# Patient Record
Sex: Female | Born: 1980
Health system: Southern US, Community
[De-identification: ages and names within clinical notes are randomized; demographics above are authoritative.]

## PROBLEM LIST (undated history)

## (undated) DIAGNOSIS — T7840XA Allergy, unspecified, initial encounter: Secondary | ICD-10-CM

## (undated) DIAGNOSIS — G43909 Migraine, unspecified, not intractable, without status migrainosus: Secondary | ICD-10-CM

## (undated) DIAGNOSIS — I10 Essential (primary) hypertension: Secondary | ICD-10-CM

## (undated) HISTORY — DX: Allergy, unspecified, initial encounter: T78.40XA

## (undated) HISTORY — DX: Essential (primary) hypertension: I10

## (undated) HISTORY — DX: Migraine, unspecified, not intractable, without status migrainosus: G43.909

---
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
## 2002-12-09 HISTORY — PX: SHOULDER SURGERY: SHX246

## 2006-12-31 ENCOUNTER — Encounter: Admission: RE | Admit: 2006-12-31 | Discharge: 2007-03-04 | Payer: Self-pay | Admitting: Orthopaedic Surgery

## 2007-07-06 ENCOUNTER — Emergency Department (HOSPITAL_COMMUNITY): Admission: EM | Admit: 2007-07-06 | Discharge: 2007-07-06 | Payer: Self-pay | Admitting: Emergency Medicine

## 2008-07-05 ENCOUNTER — Ambulatory Visit (HOSPITAL_COMMUNITY): Admission: RE | Admit: 2008-07-05 | Discharge: 2008-07-05 | Payer: Self-pay | Admitting: General Practice

## 2011-09-06 LAB — CBC
MCV: 83
Platelets: 288
RBC: 5.18 — ABNORMAL HIGH
WBC: 5.1

## 2011-09-06 LAB — SEDIMENTATION RATE: Sed Rate: 8

## 2011-09-06 LAB — DIFFERENTIAL
Basophils Absolute: 0
Eosinophils Relative: 3
Lymphocytes Relative: 31
Lymphs Abs: 1.6
Neutro Abs: 3.1
Neutrophils Relative %: 60

## 2011-09-06 LAB — C-REACTIVE PROTEIN: CRP: 0.4 — ABNORMAL LOW (ref <0.6)

## 2013-12-09 HISTORY — PX: NASAL SEPTUM SURGERY: SHX37

## 2016-07-29 ENCOUNTER — Ambulatory Visit (INDEPENDENT_AMBULATORY_CARE_PROVIDER_SITE_OTHER): Payer: BLUE CROSS/BLUE SHIELD | Admitting: Podiatry

## 2016-07-29 ENCOUNTER — Ambulatory Visit (INDEPENDENT_AMBULATORY_CARE_PROVIDER_SITE_OTHER): Payer: BLUE CROSS/BLUE SHIELD

## 2016-07-29 DIAGNOSIS — M722 Plantar fascial fibromatosis: Secondary | ICD-10-CM | POA: Diagnosis not present

## 2016-07-29 DIAGNOSIS — M79672 Pain in left foot: Secondary | ICD-10-CM

## 2016-07-29 DIAGNOSIS — M79671 Pain in right foot: Secondary | ICD-10-CM | POA: Diagnosis not present

## 2016-07-29 MED ORDER — TRIAMCINOLONE ACETONIDE 10 MG/ML IJ SUSP: 10.0000 mg | Freq: Once | INTRAMUSCULAR | Status: DC

## 2016-07-29 MED ORDER — TRIAMCINOLONE ACETONIDE 10 MG/ML IJ SUSP
10.0000 mg | Freq: Once | INTRAMUSCULAR | Status: AC
  Administered 2016-07-29: 10 mg

## 2016-07-29 MED ORDER — DICLOFENAC SODIUM 75 MG PO TBEC: 75.0000 mg | DELAYED_RELEASE_TABLET | Freq: Two times a day (BID) | ORAL | 2 refills | Status: DC

## 2016-07-29 NOTE — Progress Notes (Signed)
Subjective:     Patient ID: Kristin Downs, female   DOB: 1981/10/08, 35 y.o.   MRN: [id number]  HPI patient presents stating that she's been having a lot of pain in both of her heels and it's been going on now for at least 6 months and she is a nurse   Review of Systems  All other systems reviewed and are negative.      Objective:   Physical Exam  Constitutional: She is oriented to person, place, and time.  Cardiovascular: Intact distal pulses.   Musculoskeletal: Normal range of motion.  Neurological: She is oriented to person, place, and time.  Skin: Skin is warm.  Nursing note and vitals reviewed.  Neurovascular status intact muscle strength adequate range of motion within normal limits with patient found to have a lot of discomfort plantar heel bilateral at the insertional point of the tendon into the calcaneus with depression of the arch noted. Patient's found have good digital perfusion and is well oriented 3     Assessment:     Acute plantar fasciitis bilateral    Plan:     H&P condition reviewed and x-rays reviewed. Injected the plantar fascia bilateral 3 mg Kenalog 5 mill grams Xylocaine and applied fascial brace bilateral and placed on diclofenac 75 mg twice a day. Patient was given instructions on physical therapy will be seen back in 2 weeks  X-ray report indicated spur formation with mild depression of the arch and no indication stress fracture arthritis

## 2016-07-29 NOTE — Progress Notes (Signed)
   Subjective:    Patient ID: Kristin Downs, Kristin Downs    DOB: 05/18/1981, 35 y.o.   MRN: [id number]  HPI  I am having pain in both heels for the last 6 months it has gotten worse.  On my feet 8-10 hours per day     Review of Systems  Neurological: Positive for headaches.       Objective:   Physical Exam        Assessment & Plan:

## 2016-07-29 NOTE — Patient Instructions (Signed)

## 2016-08-15 ENCOUNTER — Ambulatory Visit (INDEPENDENT_AMBULATORY_CARE_PROVIDER_SITE_OTHER): Payer: BLUE CROSS/BLUE SHIELD | Admitting: Podiatry

## 2016-08-15 ENCOUNTER — Encounter: Payer: Self-pay | Admitting: Podiatry

## 2016-08-15 DIAGNOSIS — M722 Plantar fascial fibromatosis: Secondary | ICD-10-CM | POA: Diagnosis not present

## 2016-08-15 MED ORDER — TRIAMCINOLONE ACETONIDE 10 MG/ML IJ SUSP
10.0000 mg | Freq: Once | INTRAMUSCULAR | Status: AC
  Administered 2016-08-15: 10 mg

## 2016-08-18 NOTE — Progress Notes (Signed)
Subjective:     Patient ID: Kristin Downs, female   DOB: 1981-09-09, 35 y.o.   MRN: [id number]  HPI patient states that I'm still getting pain but it is mildly improved from previously   Review of Systems     Objective:   Physical Exam Neurovascular status intact with pain in the right over left heel with inflammation and fluid around the medial band and depression of the arch also noted    Assessment:     Plantar fasciitis right with inflammation and fluid around the medial band right over left    Plan:     H&P x-rays reviewed and injected the plantar fascia 3 mg Kenalog 5 mg Xylocaine and applied scanning to the feet for orthotic therapy. Advised on physical therapy supportive shoes and reappoint to recheck

## 2016-09-05 ENCOUNTER — Ambulatory Visit (INDEPENDENT_AMBULATORY_CARE_PROVIDER_SITE_OTHER): Payer: BLUE CROSS/BLUE SHIELD | Admitting: Podiatry

## 2016-09-05 DIAGNOSIS — M722 Plantar fascial fibromatosis: Secondary | ICD-10-CM | POA: Diagnosis not present

## 2016-09-05 MED ORDER — TRIAMCINOLONE ACETONIDE 10 MG/ML IJ SUSP
10.0000 mg | Freq: Once | INTRAMUSCULAR | Status: AC
Start: 2016-09-05 — End: 2016-09-05
  Administered 2016-09-05: 10 mg

## 2016-09-06 NOTE — Progress Notes (Signed)
Subjective:     Patient ID: Kristin Downs, Kristin Downs   DOB: September 09, 1981, 35 y.o.   MRN: [id number]  HPI patient presents stating I am having a lot of pain in my right heel but it is improved from previous and the left one feels good   Review of Systems     Objective:   Physical Exam Neurovascular status intact with discomfort plantar heel right over left    Assessment:     Plantar fasciitis improving but still present right over left    Plan:     Orthotics dispensed and injected the right plantar fashion 3 mg Kenalog 5 mg Xylocaine and gave instructions on physical therapy

## 2016-09-27 ENCOUNTER — Telehealth: Payer: Self-pay | Admitting: *Deleted

## 2016-09-27 NOTE — Telephone Encounter (Addendum)
Pt states she just finished a 14 mile hike in 2 days and her feet do not hurt, the orthotics are great. 01/22/2017-Pt requested 2nd pair of the orthotics.

## 2016-10-17 ENCOUNTER — Ambulatory Visit: Payer: BLUE CROSS/BLUE SHIELD | Admitting: Podiatry

## 2017-01-14 MED FILL — SUMATRIPTAN SUCC 50 MG TAB: 50 | 30 days supply | Qty: 18 | Fill #0

## 2017-01-14 MED FILL — FLUoxetine HCL 40 MG CAPS: 40 | 90 days supply | Qty: 90 | Fill #0

## 2017-01-20 DIAGNOSIS — H1132 Conjunctival hemorrhage, left eye: Secondary | ICD-10-CM | POA: Diagnosis not present

## 2017-01-20 DIAGNOSIS — H53143 Visual discomfort, bilateral: Secondary | ICD-10-CM | POA: Diagnosis not present

## 2017-01-20 DIAGNOSIS — H539 Unspecified visual disturbance: Secondary | ICD-10-CM | POA: Diagnosis not present

## 2017-01-27 ENCOUNTER — Telehealth: Payer: Self-pay | Admitting: *Deleted

## 2017-01-27 NOTE — Telephone Encounter (Signed)
Pt had requested a second pair of orthotics be ordered on January 23, 2016. (Pt was originally scanned on 08/15/16)  I Left message for patient at [phone] (Home #) to let them know that I sent an [facility]-mail to [facility] requesting a second pair of orthotics be made and they should be in in 3 weeks. We will call patient when they come in.

## 2017-01-28 MED FILL — LEVONOR-ETH ESTRAD 0.1-0.02: 0.1-20 | 84 days supply | Qty: 84 | Fill #0

## 2017-01-28 MED FILL — DICLOFENAC SOD 75 MG TAB EC: 75 | 25 days supply | Qty: 50 | Fill #0

## 2017-02-04 MED FILL — SCOPOLAMINE 1 MG/3 DAY PATC: 1 | 12 days supply | Qty: 4 | Fill #0

## 2017-04-17 MED FILL — FLUoxetine HCL 40 MG CAPS: 40 | 90 days supply | Qty: 90 | Fill #1

## 2017-04-17 MED FILL — LEVONOR-ETH ESTRAD 0.1-0.02: 0.1-20 | 84 days supply | Qty: 84 | Fill #1

## 2017-05-10 DIAGNOSIS — R110 Nausea: Secondary | ICD-10-CM | POA: Diagnosis not present

## 2017-05-10 DIAGNOSIS — R51 Headache: Secondary | ICD-10-CM | POA: Diagnosis not present

## 2017-05-10 DIAGNOSIS — G43009 Migraine without aura, not intractable, without status migrainosus: Secondary | ICD-10-CM | POA: Diagnosis not present

## 2017-05-19 DIAGNOSIS — M722 Plantar fascial fibromatosis: Secondary | ICD-10-CM | POA: Diagnosis not present

## 2017-06-16 MED FILL — AMOX-CLAV 875-125 MG TABLET: 875-125 | 10 days supply | Qty: 20 | Fill #0

## 2017-07-08 MED FILL — FLUoxetine HCL 40 MG CAPS: 40 | 90 days supply | Qty: 90 | Fill #2

## 2017-07-08 MED FILL — LEVONOR-ETH ESTRAD 0.1-0.02: 0.1-20 | 84 days supply | Qty: 84 | Fill #2

## 2017-10-08 DIAGNOSIS — B078 Other viral warts: Secondary | ICD-10-CM | POA: Diagnosis not present

## 2017-10-14 MED FILL — SUMATRIPTAN SUCC 50 MG TABL: 50 | 30 days supply | Qty: 18 | Fill #1

## 2017-10-14 MED FILL — FLUoxetine HCL 40 MG CAPS: 40 | 90 days supply | Qty: 90 | Fill #3

## 2017-10-14 MED FILL — LEVONOR-ETH ESTRAD 0.1-0.02: 0.1-20 | 84 days supply | Qty: 84 | Fill #3

## 2017-12-09 DIAGNOSIS — I10 Essential (primary) hypertension: Secondary | ICD-10-CM

## 2017-12-09 HISTORY — DX: Essential (primary) hypertension: I10

## 2018-01-06 MED FILL — LEVONOR-ETH ESTRAD 0.1-0.02: 0.1-20 | 28 days supply | Qty: 28 | Fill #0

## 2018-01-06 MED FILL — FLUoxetine HCL 40 MG CAPS: 40 | 30 days supply | Qty: 30 | Fill #0

## 2018-01-29 DIAGNOSIS — Z01419 Encounter for gynecological examination (general) (routine) without abnormal findings: Secondary | ICD-10-CM | POA: Diagnosis not present

## 2018-01-29 DIAGNOSIS — Z3041 Encounter for surveillance of contraceptive pills: Secondary | ICD-10-CM | POA: Diagnosis not present

## 2018-01-29 DIAGNOSIS — F419 Anxiety disorder, unspecified: Secondary | ICD-10-CM | POA: Diagnosis not present

## 2018-01-29 DIAGNOSIS — Z124 Encounter for screening for malignant neoplasm of cervix: Secondary | ICD-10-CM | POA: Diagnosis not present

## 2018-01-29 MED FILL — busPIRone HCL 5 MG TABS: 5 | 30 days supply | Qty: 90 | Fill #0

## 2018-01-29 MED FILL — LEVONOR-ETH ESTRAD 0.1-0.02: 0.1-20 | 84 days supply | Qty: 84 | Fill #0

## 2018-01-30 MED FILL — FLUoxetine HCL 40 MG CAPS: 40 | 90 days supply | Qty: 90 | Fill #0

## 2018-03-06 DIAGNOSIS — G43119 Migraine with aura, intractable, without status migrainosus: Secondary | ICD-10-CM | POA: Diagnosis not present

## 2018-03-06 DIAGNOSIS — R110 Nausea: Secondary | ICD-10-CM | POA: Diagnosis not present

## 2018-03-16 MED FILL — CLOTRIMAZOLE-BETAMETHASONE: 1-0.05 | 14 days supply | Qty: 15 | Fill #0

## 2018-05-05 MED FILL — FLUoxetine HCL 40 MG CAPS: 40 | 90 days supply | Qty: 90 | Fill #1

## 2018-05-05 MED FILL — busPIRone HCL 5 MG TABS: 5 | 30 days supply | Qty: 90 | Fill #1

## 2018-05-05 MED FILL — LEVONOR-ETH ESTRAD 0.1-0.02: 0.1-20 | 84 days supply | Qty: 84 | Fill #1

## 2018-06-03 DIAGNOSIS — L309 Dermatitis, unspecified: Secondary | ICD-10-CM | POA: Diagnosis not present

## 2018-06-03 MED FILL — HALOBETASOL PROP 0.05% CRM: 0.05 | 30 days supply | Qty: 50 | Fill #0

## 2018-06-16 DIAGNOSIS — I10 Essential (primary) hypertension: Secondary | ICD-10-CM | POA: Diagnosis not present

## 2018-06-16 DIAGNOSIS — G43119 Migraine with aura, intractable, without status migrainosus: Secondary | ICD-10-CM | POA: Diagnosis not present

## 2018-06-18 DIAGNOSIS — Z309 Encounter for contraceptive management, unspecified: Secondary | ICD-10-CM | POA: Diagnosis not present

## 2018-06-19 MED FILL — NORETHINDRONE 0.35 MG TAB: 0.35 | 28 days supply | Qty: 28 | Fill #0

## 2018-06-27 ENCOUNTER — Ambulatory Visit (HOSPITAL_COMMUNITY)
Admission: EM | Admit: 2018-06-27 | Discharge: 2018-06-27 | Disposition: A | Discharge status: Home / Self Care | Payer: 59 | Attending: Family Medicine | Admitting: Family Medicine

## 2018-06-27 ENCOUNTER — Other Ambulatory Visit: Payer: Self-pay

## 2018-06-27 ENCOUNTER — Encounter (HOSPITAL_COMMUNITY): Payer: Self-pay

## 2018-06-27 ENCOUNTER — Ambulatory Visit (INDEPENDENT_AMBULATORY_CARE_PROVIDER_SITE_OTHER): Payer: 59

## 2018-06-27 ENCOUNTER — Ambulatory Visit (HOSPITAL_COMMUNITY): Payer: 59

## 2018-06-27 DIAGNOSIS — S62639A Displaced fracture of distal phalanx of unspecified finger, initial encounter for closed fracture: Secondary | ICD-10-CM

## 2018-06-27 DIAGNOSIS — S62665A Nondisplaced fracture of distal phalanx of left ring finger, initial encounter for closed fracture: Secondary | ICD-10-CM

## 2018-06-27 DIAGNOSIS — S6010XA Contusion of unspecified finger with damage to nail, initial encounter: Secondary | ICD-10-CM

## 2018-06-27 MED ORDER — MELOXICAM 7.5 MG PO TABS: 7.5000 mg | ORAL_TABLET | Freq: Every day | ORAL | 0 refills | Status: DC

## 2018-06-27 MED ORDER — MUPIROCIN 2 % EX OINT: 1.0000 "application " | TOPICAL_OINTMENT | Freq: Two times a day (BID) | CUTANEOUS | 0 refills | Status: DC

## 2018-06-27 MED ORDER — HYDROCODONE-ACETAMINOPHEN 5-325 MG PO TABS: 1.0000 | ORAL_TABLET | Freq: Four times a day (QID) | ORAL | 0 refills | Status: DC | PRN

## 2018-06-27 NOTE — Discharge Instructions (Signed)
Fracture to the tip of the finger.  Mobic for pain.  Norco for breakthrough pain.  Continue ice compress, elevation.  Bactroban ointment on abrasions.  Follow-up with orthopedics for further evaluation.  Monitor for worsening numbness, tingling, decrease in sensation of the finger.

## 2018-06-27 NOTE — ED Provider Notes (Signed)
[facility]    CSN: [id number] Arrival date & time: 06/27/18  1304     History   Chief Complaint Chief Complaint  Patient presents with  . Finger Injury    HPI Bobbie Pesch is a 37 y.o. female.   37 year old female comes in for left ring finger injury after smashing finger between 2 logs.  Has a small laceration around the DIP area.  States has been applying ice compress, elevation for symptoms.  Has felt that her fingertips is more cool and has had numbness to the finger as well.  Bleeding controlled.  Decreased range of motion.  Has not taken anything for the symptoms.     History reviewed. No pertinent past medical history.  There are no active problems to display for this patient.   History reviewed. No pertinent surgical history.  OB History   None      Home Medications    Prior to Admission medications   Medication Sig Start Date End Date Taking? Authorizing Provider  FLUoxetine (PROZAC) 40 MG capsule Take by mouth.   Yes [provider]  Multiple Vitamins-Minerals (MULTIVITAMIN WITH MINERALS) tablet Take 1 tablet by mouth daily.   Yes [provider]  SUMAtriptan (IMITREX) 50 MG tablet Take by mouth.   Yes [provider]  SUMAtriptan (IMITREX) 50 MG tablet TK 1 T PO INITIALLY AND MAY REPEAT IN 2 H PRN 07/24/16  Yes [provider]  diphenhydrAMINE (BENADRYL) 50 MG tablet Take by mouth.    [provider]  HYDROcodone-acetaminophen (NORCO/VICODIN) 5-325 MG tablet Take 1 tablet by mouth every 6 (six) hours as needed for severe pain. 06/27/18   Cathie Hoops, Amy V, PA-C  meloxicam (MOBIC) 7.5 MG tablet Take 1 tablet (7.5 mg total) by mouth daily. 06/27/18   Cathie Hoops, Amy V, PA-C  mupirocin ointment (BACTROBAN) 2 % Apply 1 application topically 2 (two) times daily. 06/27/18   Cathie Hoops, Amy V, PA-C  ondansetron (ZOFRAN-ODT) 4 MG disintegrating tablet Take by mouth. 04/12/15   [provider]  ORSYTHIA 0.1-20 MG-MCG tablet TK  1 T PO D 07/23/16   [provider]    Family History History reviewed. No pertinent family history.  Social History Social History   Tobacco Use  . Smoking status: Never Smoker  . Smokeless tobacco: Never Used  Substance Use Topics  . Alcohol use: Yes  . Drug use: Never     Allergies   Prednisone   Review of Systems Review of Systems  Reason unable to perform ROS: See HPI as above.     Physical Exam Triage Vital Signs ED Triage Vitals  Enc Vitals Group     BP 06/27/18 1315 (!) 130/93     Pulse Rate 06/27/18 1315 90     Resp 06/27/18 1315 17     Temp 06/27/18 1315 98.4 F (36.9 C)     Temp Source 06/27/18 1315 Oral     SpO2 06/27/18 1315 97 %     Weight --      Height --      Head Circumference --      Peak Flow --      Pain Score 06/27/18 1316 6     Pain Loc --      Pain Edu? --      Excl. in GC? --    No data found.  Updated Vital Signs BP (!) 130/93 (BP Location: Left Arm)   Pulse 90   Temp 98.4 F (36.9  C) (Oral)   Resp 17   LMP 06/22/2018 (Exact Date)   SpO2 97%   Physical Exam  Constitutional: She is oriented to person, place, and time. She appears well-developed and well-nourished. No distress.  HENT:  Head: Normocephalic and atraumatic.  Eyes: Pupils are equal, round, and reactive to light. Conjunctivae are normal.  Musculoskeletal:  See picture below.  Tip of finger is red without cyanosis.  Fingers warm and dry.  Tenderness to palpation of distal finger.  Decreased range of motion.  Strength deferred.  Sensation intact and equal bilaterally.  Radial pulse 2+ and equal bilaterally.  Cap refill <2s  Neurological: She is alert and oriented to person, place, and time.  Skin: She is not diaphoretic.          UC Treatments / Results  Labs (all labs ordered are listed, but only abnormal results are displayed) Labs Reviewed - No data to display  EKG None  Radiology Dg Finger Ring Left  Result Date: 06/27/2018 CLINICAL  DATA:  Smashed finger between 2 logs. EXAM: LEFT RING FINGER 2+V COMPARISON:  None FINDINGS: There is a nondisplaced fracture of the distal tuft. Joint spaces are maintained. No radiopaque foreign body. IMPRESSION: Nondisplaced distal tuft fracture of the ring finger. Electronically Signed   By: Rudie MeyerP.  Gallerani M.D.   On: 06/27/2018 14:00    Procedures Incision and Drainage Date/Time: 06/27/2018 2:22 PM Performed by: Belinda FisherYu, Amy V, PA-C Authorized by: Sharlene DoryWendling, Nicholas Paul, DO   Consent:    Consent obtained:  Verbal   Consent given by:  Patient   Risks discussed:  Incomplete drainage, infection, pain and bleeding   Alternatives discussed:  No treatment Location:    Type:  Subungual hematoma   Size:  0.5cm   Location:  Upper extremity   Upper extremity location:  Finger   Finger location:  L ring finger Pre-procedure details:    Skin preparation:  Hibiclens Anesthesia (see MAR for exact dosages):    Anesthesia method:  None Procedure type:    Complexity:  Simple Procedure details:    Incision type: trephination.   Incision depth:  Subungual   Drainage:  Bloody   Drainage amount:  Moderate   Wound treatment:  Wound left open Post-procedure details:    Patient tolerance of procedure:  Tolerated well, no immediate complications   (including critical care time)  Medications Ordered in UC Medications - No data to display  Initial Impression / Assessment and Plan / UC Course  I have reviewed the triage vital signs and the nursing notes.  Pertinent labs & imaging results that were available during my care of the patient were reviewed by me and considered in my medical decision making (see chart for details).    Discussed x-ray results with patient.  Tolerated nail trephination without difficulty.  States improved symptoms.  Start Mobic as directed, discussed increased risk of GI bleed with interaction to fluoxetine, discussed monitoring symptoms, patient expresses understanding.   Norco for breakthrough pain.  Bactroban for abrasions.  Return precautions given.  Otherwise follow-up with orthopedics for further evaluation management needed.  Patient expresses understanding and agrees to plan.  Final Clinical Impressions(s) / UC Diagnoses   Final diagnoses:  Closed fracture of tuft of distal phalanx of finger    ED Prescriptions    Medication Sig Dispense Auth. Provider   mupirocin ointment (BACTROBAN) 2 % Apply 1 application topically 2 (two) times daily. 22 g Yu, Amy V, PA-C   meloxicam (MOBIC) 7.5  MG tablet Take 1 tablet (7.5 mg total) by mouth daily. 15 tablet Yu, Amy V, PA-C   HYDROcodone-acetaminophen (NORCO/VICODIN) 5-325 MG tablet Take 1 tablet by mouth every 6 (six) hours as needed for severe pain. 10 tablet Belinda Fisher, PA-C     Controlled Substance Prescriptions [place] Controlled Substance Registry consulted? Yes, I have consulted the [place] Controlled Substances Registry for this patient, and feel the risk/benefit ratio today is favorable for proceeding with this prescription for a controlled substance.   Belinda Fisher, PA-C 06/27/18 1425

## 2018-06-27 NOTE — ED Notes (Signed)
Bed: UC01 Expected date:  Expected time:  Means of arrival:  Comments: appt 

## 2018-06-27 NOTE — ED Triage Notes (Signed)
[facility] and laceration to left ring finger since last night, pt states she smashed her in finger in between two logs, pt also complains that tip of finger is numb and cool at tip

## 2018-07-08 ENCOUNTER — Encounter (INDEPENDENT_AMBULATORY_CARE_PROVIDER_SITE_OTHER): Payer: Self-pay | Admitting: Physician Assistant

## 2018-07-08 ENCOUNTER — Ambulatory Visit (INDEPENDENT_AMBULATORY_CARE_PROVIDER_SITE_OTHER): Payer: 59 | Admitting: Physician Assistant

## 2018-07-08 DIAGNOSIS — M79645 Pain in left finger(s): Secondary | ICD-10-CM | POA: Diagnosis not present

## 2018-07-08 NOTE — Progress Notes (Signed)
   Office Visit Note   Patient: Kristin Downs           Date of Birth: 03-Oct-1981           MRN: [id number] Visit Date: 07/08/2018              Requested by: Kristin ChancellorPa, [facility] [address] [place], [place] [address] PCP: Kristin BirchwoodPa, [facility]   Assessment & Plan: Visit Diagnoses:  1. Pain in left finger(s)     Plan: She will wear the aluminum splint for the next 2-1/2 weeks.  Then she can start coming out of the splint and working on range motion of the finger as tolerated.  She will follow-up with us on an as-needed basis pain persist or becomes worse.  Questions encouraged and answered.  Did discuss with her that she may lose the fingernail.  Follow-Up Instructions: Return if symptoms worsen or fail to improve.   Orders:  No orders of the defined types were placed in this encounter.  No orders of the defined types were placed in this encounter.     Procedures: No procedures performed   Clinical Data: No additional findings.   Subjective: Chief Complaint  Patient presents with  . Left Hand - Pain  . Ring Finger    HPI Kristin Downs is a 37 year old female who week and a half ago smashed her left ring finger between 2 logs.  She is seen at, urgent care where x-rays were taken and showed a tuft fracture of the left ring finger.  There was no intra-articular involvement.  She is placed in a splint appropriate.  She is doing okay until 07/07/2018 with someone stepped on her finger.  Is now having some throbbing pain it is diminishing as of today.  She does not want any additional x-rays of her finger.  Subungual hematoma I&D was performed. Review of Systems Please see HPI otherwise negative she will wear the  Objective: Vital Signs: LMP 06/22/2018 (Exact Date)   Physical Exam  Constitutional: She is oriented to person, place, and time. She appears well-developed and well-nourished. No distress.  Cardiovascular: Intact distal pulses.    Neurological: She is alert and oriented to person, place, and time.  Skin: She is not diaphoretic.  Psychiatric: She has a normal mood and affect.    Ortho Exam Left hand she has slight bruising over the volar aspect of the index finger.  She is able to flex through the DIP joint.  She has full extension of the left index finger.  Nail is intact.  She has tenderness over just the distal phalanx of the left index finger.  There is no pain with palpation throughout the remainder of the hand. Specialty Comments:  No specialty comments available.  Imaging: No results found.   PMFS History: There are no active problems to display for this patient.  History reviewed. No pertinent past medical history.  History reviewed. No pertinent family history.  History reviewed. No pertinent surgical history. Social History   Occupational History  . Not on file  Tobacco Use  . Smoking status: Never Smoker  . Smokeless tobacco: Never Used  Substance and Sexual Activity  . Alcohol use: Yes  . Drug use: Never  . Sexual activity: Yes

## 2018-07-15 MED FILL — HYDROCHLOROTHIAZIDE 25 MG T: 25 | 90 days supply | Qty: 90 | Fill #0

## 2018-07-15 MED FILL — NORETHINDRONE 0.35 MG TAB: 0.35 | 84 days supply | Qty: 84 | Fill #1

## 2018-07-15 MED FILL — busPIRone HCL 5 MG TABS: 5 | 30 days supply | Qty: 90 | Fill #2

## 2018-07-18 ENCOUNTER — Ambulatory Visit (INDEPENDENT_AMBULATORY_CARE_PROVIDER_SITE_OTHER): Payer: 59

## 2018-07-18 ENCOUNTER — Encounter (HOSPITAL_COMMUNITY): Payer: Self-pay

## 2018-07-18 ENCOUNTER — Ambulatory Visit (HOSPITAL_COMMUNITY)
Admission: EM | Admit: 2018-07-18 | Discharge: 2018-07-18 | Disposition: A | Discharge status: Home / Self Care | Payer: 59 | Attending: Internal Medicine | Admitting: Internal Medicine

## 2018-07-18 ENCOUNTER — Other Ambulatory Visit: Payer: Self-pay

## 2018-07-18 DIAGNOSIS — M79671 Pain in right foot: Secondary | ICD-10-CM

## 2018-07-18 DIAGNOSIS — S92354A Nondisplaced fracture of fifth metatarsal bone, right foot, initial encounter for closed fracture: Secondary | ICD-10-CM | POA: Diagnosis not present

## 2018-07-18 NOTE — Discharge Instructions (Addendum)
Take aleve as prescribed by back of box.

## 2018-07-18 NOTE — ED Provider Notes (Signed)
[facility]    CSN: [id number] Arrival date & time: 07/18/18  1356     [id number]   Chief Complaint No chief complaint on file.   HPI Kristin Downs is a 37 y.o. female.   37 year old female presents with injuries to her right foot after twisting going up the stairs today at work.  Patient has a abrasion to the lateral aspect of the fifth metatarsal on right foot with a large hematoma by the cuboid.  She states she is able to bear weight on the affected extremity.  Condition is acute in nature.  Condition is made better by nothing.  Condition is made worse by nothing.  Patient denies any treatment prior to arrival this facility.  Patient has increased pain with motion however full range of motion is intact.  Extremity is warm and dry cap refill less than 2.     No past medical history on file.  There are no active problems to display for this patient.   No past surgical history on file.  OB History   None      Home Medications    Prior to Admission medications   Medication Sig Start Date End Date Taking? Authorizing Provider  diphenhydrAMINE (BENADRYL) 50 MG tablet Take by mouth.    [provider]  FLUoxetine (PROZAC) 40 MG capsule Take by mouth.    [provider]  HYDROcodone-acetaminophen (NORCO/VICODIN) 5-325 MG tablet Take 1 tablet by mouth every 6 (six) hours as needed for severe pain. 06/27/18   Cathie HoopsYu, Amy V, PA-C  meloxicam (MOBIC) 7.5 MG tablet Take 1 tablet (7.5 mg total) by mouth daily. 06/27/18   Cathie HoopsYu, Amy V, PA-C  Multiple Vitamins-Minerals (MULTIVITAMIN WITH MINERALS) tablet Take 1 tablet by mouth daily.    [provider]  mupirocin ointment (BACTROBAN) 2 % Apply 1 application topically 2 (two) times daily. 06/27/18   Cathie HoopsYu, Amy V, PA-C  ondansetron (ZOFRAN-ODT) 4 MG disintegrating tablet Take by mouth. 04/12/15   [provider]  ORSYTHIA 0.1-20 MG-MCG tablet TK 1 T PO D 07/23/16   [provider]  SUMAtriptan  (IMITREX) 50 MG tablet Take by mouth.    [provider]  SUMAtriptan (IMITREX) 50 MG tablet TK 1 T PO INITIALLY AND MAY REPEAT IN 2 H PRN 07/24/16   [provider]    Family History No family history on file.  Social History Social History   Tobacco Use  . Smoking status: Never Smoker  . Smokeless tobacco: Never Used  Substance Use Topics  . Alcohol use: Yes  . Drug use: Never     Allergies   Prednisone   Review of Systems Review of Systems  Constitutional: Negative for chills and fever.  HENT: Negative for ear pain and sore throat.   Eyes: Negative for pain and visual disturbance.  Respiratory: Negative for cough and shortness of breath.   Cardiovascular: Negative for chest pain and palpitations.  Gastrointestinal: Negative for abdominal pain and vomiting.  Genitourinary: Negative for dysuria and hematuria.  Musculoskeletal: Negative for arthralgias and back pain.       Pain to right foot  Skin: Negative for color change and rash.  Neurological: Negative for seizures and syncope.  All other systems reviewed and are negative.    Physical Exam Triage Vital Signs ED Triage Vitals [07/18/18 1448]  Enc Vitals Group     BP (!) 150/97     Pulse Rate 88     Resp 16  Temp 98.5 F (36.9 C)     Temp Source Oral     SpO2 99 %     Weight      Height      Head Circumference      Peak Flow      Pain Score      Pain Loc      Pain Edu?      Excl. in GC?    No data found.  Updated Vital Signs BP (!) 150/97 (BP Location: Left Arm)   Pulse 88   Temp 98.5 F (36.9 C) (Oral)   Resp 16   LMP 06/22/2018 (Exact Date)   SpO2 99%   Visual Acuity Right Eye Distance:   Left Eye Distance:   Bilateral Distance:    Right Eye Near:   Left Eye Near:    Bilateral Near:     Physical Exam  Constitutional: She is oriented to person, place, and time. She appears well-developed and well-nourished.  HENT:  Head: Normocephalic and atraumatic.  Eyes:  Conjunctivae are normal.  Neck: Normal range of motion.  Pulmonary/Chest: Effort normal.  Musculoskeletal: She exhibits edema and tenderness.  Abrasion to fifth metatarsal.,  Hematoma noted to be in the lateral aspect by the cuboid bone  Neurological: She is alert and oriented to person, place, and time.  Skin: Skin is warm and dry. Capillary refill takes less than 2 seconds.  Psychiatric: She has a normal mood and affect.  Nursing note and vitals reviewed.    UC Treatments / Results  Labs (all labs ordered are listed, but only abnormal results are displayed) Labs Reviewed - No data to display  EKG None  Radiology No results found.  Procedures Procedures (including critical care time)  Medications Ordered in UC Medications - No data to display  Initial Impression / Assessment and Plan / UC Course  I have reviewed the triage vital signs and the nursing notes.  Pertinent labs & imaging results that were available during my care of the patient were reviewed by me and considered in my medical decision making (see chart for details).      Final Clinical Impressions(s) / UC Diagnoses   Final diagnoses:  None   Discharge Instructions   None    ED Prescriptions    None     Controlled Substance Prescriptions [place] Controlled Substance Registry consulted? Not Applicable   Alene Mires, NP 07/18/18 1711

## 2018-07-18 NOTE — ED Triage Notes (Signed)
Pt presents today with right foot pain that happened 2 hours prior after she tripped off a curb. She rolled her foot under and has had pain since. Very tender to walk on.

## 2018-07-23 DIAGNOSIS — Z3041 Encounter for surveillance of contraceptive pills: Secondary | ICD-10-CM | POA: Diagnosis not present

## 2018-07-29 ENCOUNTER — Ambulatory Visit (INDEPENDENT_AMBULATORY_CARE_PROVIDER_SITE_OTHER): Payer: 59 | Admitting: Family Medicine

## 2018-07-29 DIAGNOSIS — M79671 Pain in right foot: Secondary | ICD-10-CM | POA: Diagnosis not present

## 2018-07-29 NOTE — Progress Notes (Signed)
   Office Visit Note   Patient: Kristin Downs           Date of Birth: 1981/08/03           MRN: [id number] Visit Date: 07/29/2018 Requested by: Beatriz ChancellorPa, [facility] [address] [place], [place] [address] PCP: Beatriz ChancellorPa, [facility]  Subjective: Chief Complaint  Patient presents with  . Right Foot - Pain    Right foot injury 07/25/18, rolled foot over while walking on uneven surface.    HPI: She states that on August 10 she stepped on uneven surface and inverted her right foot.  Immediate pain on the lateral aspect.  Able to bear weight, but it swelled and bruised.  She went to urgent care where x-rays showed an avulsion fracture of the proximal fifth metatarsal.  She has been wearing a fracture boot.  Her pain has improved but she still has some swelling.  She has a history of previous right foot fracture which healed without complication.              ROS: She is otherwise in good health, all other systems were negative.  Objective: Vital Signs: There were no vitals taken for this visit.  Physical Exam:  Right foot: No tenderness at the proximal fibula, negative syndesmosis squeeze.  No tenderness around the ankle.  Very tender at the proximal fifth metatarsal.  She has intact tendon function with eversion of the foot against resistance.  There is bruising near the fourth and fifth toes but no tenderness there.  There is a healing abrasion on the lateral aspect of her distal fifth metatarsal.  Imaging: No x-rays obtained but ultrasound images showed nondisplaced proximal fifth metatarsal fracture with early callus formation and intact peroneus brevis tendon.  Assessment & Plan: 1.  Approximately 11 days status post right foot inversion with nondisplaced proximal fifth metatarsal fracture clinically healing -Weightbearing as tolerated, fracture boot during activity for the next 2 to 4 weeks until pain improves and then she will wean into a regular shoe.   Follow-up as needed as long his pain steadily improved.   Follow-Up Instructions: No follow-ups on file.     Procedures: None today.   PMFS History: There are no active problems to display for this patient.  No past medical history on file.  No family history on file.  No past surgical history on file. Social History   Occupational History  . Not on file  Tobacco Use  . Smoking status: Never Smoker  . Smokeless tobacco: Never Used  Substance and Sexual Activity  . Alcohol use: Yes  . Drug use: Never  . Sexual activity: Yes

## 2018-08-11 MED FILL — FLUoxetine HCL 40 MG CAPS: 40 | 90 days supply | Qty: 90 | Fill #2

## 2018-10-19 MED FILL — HYDROCHLOROTHIAZIDE 25 MG T: 25 | 90 days supply | Qty: 90 | Fill #1

## 2018-11-02 MED FILL — FLUoxetine HCL 40 MG CAPS: 40 | 90 days supply | Qty: 90 | Fill #3

## 2018-11-02 MED FILL — NORETHINDRONE 0.35 MG TAB: 0.35 | 56 days supply | Qty: 56 | Fill #0

## 2018-12-09 DIAGNOSIS — G43909 Migraine, unspecified, not intractable, without status migrainosus: Secondary | ICD-10-CM

## 2018-12-09 HISTORY — DX: Migraine, unspecified, not intractable, without status migrainosus: G43.909

## 2018-12-31 MED FILL — NORLYDA 0.35 MG TABS: 0.35 | 56 days supply | Qty: 56 | Fill #1

## 2019-02-02 MED FILL — HYDROCHLOROTHIAZIDE 25 MG T: 25 | 30 days supply | Qty: 30 | Fill #2

## 2019-02-10 MED FILL — FLUoxetine HCL 40 MG CAPS: 40 | 90 days supply | Qty: 90 | Fill #0

## 2019-02-25 ENCOUNTER — Other Ambulatory Visit: Payer: Self-pay

## 2019-02-25 ENCOUNTER — Ambulatory Visit: Payer: 59 | Admitting: Family Medicine

## 2019-02-25 ENCOUNTER — Encounter: Payer: Self-pay | Admitting: Family Medicine

## 2019-02-25 ENCOUNTER — Other Ambulatory Visit (HOSPITAL_COMMUNITY)
Admission: RE | Admit: 2019-02-25 | Discharge: 2019-02-25 | Disposition: A | Discharge status: Home / Self Care | Payer: 59 | Source: Ambulatory Visit | Attending: Family Medicine | Admitting: Family Medicine

## 2019-02-25 VITALS — BP 138/92 | HR 101 | Temp 98.9°F | Ht 72.0 in | Wt 276.8 lb

## 2019-02-25 DIAGNOSIS — Z0000 Encounter for general adult medical examination without abnormal findings: Secondary | ICD-10-CM

## 2019-02-25 DIAGNOSIS — J3089 Other allergic rhinitis: Secondary | ICD-10-CM

## 2019-02-25 DIAGNOSIS — Z124 Encounter for screening for malignant neoplasm of cervix: Secondary | ICD-10-CM | POA: Diagnosis not present

## 2019-02-25 DIAGNOSIS — G43119 Migraine with aura, intractable, without status migrainosus: Secondary | ICD-10-CM | POA: Diagnosis not present

## 2019-02-25 DIAGNOSIS — I10 Essential (primary) hypertension: Secondary | ICD-10-CM | POA: Diagnosis not present

## 2019-02-25 DIAGNOSIS — F411 Generalized anxiety disorder: Secondary | ICD-10-CM

## 2019-02-25 DIAGNOSIS — Z7689 Persons encountering health services in other specified circumstances: Secondary | ICD-10-CM | POA: Diagnosis not present

## 2019-02-25 LAB — POCT GLYCOSYLATED HEMOGLOBIN (HGB A1C): Hemoglobin A1C: 5.1 % (ref 4.0–5.6)

## 2019-02-25 MED ORDER — HYDROCHLOROTHIAZIDE 25 MG PO TABS: 25.0000 mg | ORAL_TABLET | Freq: Every day | ORAL | 3 refills | Status: DC

## 2019-02-25 MED ORDER — FLUOXETINE HCL 40 MG PO CAPS: 40.0000 mg | ORAL_CAPSULE | Freq: Every day | ORAL | 3 refills | Status: DC

## 2019-02-25 MED ORDER — NORETHINDRONE 0.35 MG PO TABS: 1.0000 | ORAL_TABLET | Freq: Every day | ORAL | 3 refills | Status: DC

## 2019-02-25 MED ORDER — FLUTICASONE PROPIONATE 50 MCG/ACT NA SUSP: 2.0000 | Freq: Every day | NASAL | 6 refills | Status: DC

## 2019-02-25 MED FILL — NORLYDA 0.35 MG TABS: 0.35 | 84 days supply | Qty: 84 | Fill #0 | Status: TO

## 2019-02-25 MED FILL — FLUTICASONE PROP 50 MCG SPR: 50 | 30 days supply | Qty: 16 | Fill #0

## 2019-02-25 NOTE — Patient Instructions (Signed)
Dear Ed Blalock,   It was very nice to meet you! Thank you for taking your time to come in to be seen. Today, we discussed the following:   Establish Care + other medical history    Refilled medications   Labs + PAP   You can check MyChart for results. If there is anything abnormal, I will call you for follow up  Please follow up in one year for annual exam or sooner for new, concerning or worsening symptoms.   I will see you around the hospital  Be well,   Dr. Genia Hotter [facility] [phone]

## 2019-02-25 NOTE — Progress Notes (Signed)
New Patient Clinic Visit  Subjective:  PCP: Melene Plan, MD Patient ID: MRN [id number]  Date of birth: 1981/05/12  CC: Establish care   HPI Kristin Downs is a 38 y.o. female who presents today to establish care. She does not have any complains today. We have reviewed her PMHx as below.   Non-Allergic Rhinitis  Allergy testing negative. Has seen ENT. Benadryl + flonase.   Anxiety  On prozac x 3 years. ON citalopram previously, but discontinued due to efficacy. Tried to taper off two months ago. Has episodes of shoulder tightness and feelings of dread about 1-2 x a month. Sometimes situational. Worse in the last four months. GAD 5.  GAD-7 Anxiety  Over the last two weeks, how often have you been bothered by the following problems?  Not at all 0 Several days 1 More than half the days 2 Nearly Everyday 3  1. Feeling nervous, anxious or on edge    x   2. Not being able to sleep or control worrying  x     3. Worrying too much about different things  x    4. Trouble relaxing x     5. Being so restless that it is hard to sit still x     6. Becoming easily annoyed or irritable  x    7. Feeling afraid as if something awful might happen  x    TOTALS       Hypertension:  Takes BP at home or at work. Typically good when she takes her medicine. About 6 months ago, experienced an episode of feeling hot in her face, headache (not typical of migraine).  Migraines  Overall improved with BP medication. Has not had to take imitrex in several months   Review of Systems  Constitutional: Negative.   HENT: Positive for sore throat.        Typical for this time of year with allergies  Eyes: Negative.   Respiratory: Positive for cough. Negative for shortness of breath and wheezing.   Cardiovascular: Negative.   Gastrointestinal: Negative.   Genitourinary: Negative.   Musculoskeletal: Negative.   Skin: Negative.   Neurological: Negative.   Endo/Heme/Allergies: Negative.    Psychiatric/Behavioral: The patient is nervous/anxious.     HISTORY Reviewed with patient and updated in EMR as appropriate.  Allergies and Medications Allergies  Allergen Reactions  . Other Rash    Conch fish  . Prednisone Anxiety   Current Meds  Medication Sig  . diphenhydrAMINE (BENADRYL) 50 MG tablet Take 50 mg by mouth at bedtime as needed for allergies.   Marland Kitchen FLUoxetine (PROZAC) 40 MG capsule Take 1 capsule (40 mg total) by mouth daily.  . hydrochlorothiazide (HYDRODIURIL) 25 MG tablet Take 1 tablet (25 mg total) by mouth daily.  . Multiple Vitamins-Minerals (MULTIVITAMIN WITH MINERALS) tablet Take 1 tablet by mouth daily.  . norethindrone (MICRONOR,CAMILA,ERRIN) 0.35 MG tablet Take 1 tablet (0.35 mg total) by mouth daily.  . ondansetron (ZOFRAN-ODT) 4 MG disintegrating tablet Take by mouth.  . [DISCONTINUED] FLUoxetine (PROZAC) 40 MG capsule Take 40 mg by mouth.   . [DISCONTINUED] hydrochlorothiazide (HYDRODIURIL) 25 MG tablet Take 25 mg by mouth daily.  . [DISCONTINUED] norethindrone (MICRONOR,CAMILA,ERRIN) 0.35 MG tablet Take by mouth.   Past Medical History Past Medical History:  Diagnosis Date  . Allergies 2000  . Hypertension 2019  . Migraines 2020    Past Surgical History Past Surgical History:  Procedure Laterality Date  . NASAL SEPTUM SURGERY Right 2015  .  SHOULDER SURGERY Right 2004   2004-2009 - THREE surgeries [place] joint fracture    Menstrual History:  Period Cycle (Days): 28 Period Duration (Days): 5 Period Pattern: Regular Menstrual Flow: Heavy Dysmenorrhea: (!) Mild Dysmenorrhea Symptoms: Other (Comment)(migraines)  OB History:  OB History    Gravida  0   Para  0   Term  0   Preterm  0   AB  0   Living  0     SAB  0   TAB  0   Ectopic  0   Multiple  0   Live Births  0        Obstetric Comments  No plans for children        PAP Smear Last year, but lost sample.  Never any abnormal pap smears   Family History family  history includes Alzheimer's disease in her maternal grandfather and maternal grandmother; Heart disease in her paternal grandmother; High Cholesterol in her father; Hypertension in her father and mother; Kidney cancer (age of onset: 64) in her father; Migraines in her brother, sister, and sister; Mood Disorder in her sister.   Social History Social History   Social History Narrative   Patient lives at home with husband, Randol and her dog Abby      Religious and personal beliefs: No blood, Jehovah's Witness   Does not exercise regularly, never tobacco user.  No drug use.  Yes alcohol use, beer liquor and wine approximately 2 times per week.  Low risk for STD exposure.  She does feel safe in her relationship.   For fun, she snowboards and camps.    [name] reports that she has never smoked. She has never used smokeless tobacco. She reports current alcohol use. She reports that she does not use drugs. Objective:  Physical Exam:  BP (!) 138/92   Pulse (!) 101   Temp 98.9 F (37.2 C) (Oral)   Ht 6' (1.829 m)   Wt 276 lb 12.8 oz (125.6 kg)   LMP 01/27/2019 (Approximate)   SpO2 98%   BMI 37.54 kg/m   Gen: NAD, alert, non-toxic, well-nourished, well-appearing, pleasant HEENT: Normocephaic, atraumatic. PERRLA, clear conjuctiva, no scleral icterus and injection. Normal EOM.  Hearing intact. TM pearly grey bilaterally with no fluid. Left TM with mild scarring.  Neck supple with no LAD, nodules, or gross abnormality. No goiter appreciated.  Nares patent with no discharge.  Maxillary and frontal sinuses nontender to palpation.  Oropharynx without erythema and lesions.  Tonsils nonswollen and without exudate.  Good dentition.  CV: Regular rate and rhythm.  Normal S1-S2.  No murmur, gallops, S3, S4 appreciated.  Normal capillary refill bilaterally.  Radial pulses 2+ bilaterally. No bilateral lower extremity edema. Resp: Clear to auscultation bilaterally.  No wheezing, rales, rhonchi, or other  abnormal lung sounds.  No increased work of breathing appreciated. Abd: Nontender and nondistended on palpation to all 4 quadrants.  Positive bowel sounds. Skin: No obvious rashes, lesions, or trauma.  Normal turgor.  MSK: Normal ROM. Normal strength and tone.  Neuro: Cranial nerves II through VI grossly intact. Gait normal.  Alert and oriented x4.  No obvious abnormal movements. Psych: Cooperative with exam.  Normal speech. Pleasant. Makes good eye contact. GU: External vulva and vagina nonerythematous, without any obvious lesions or rash.  No abnormal discharge appreciated. Large plastic speculum required as patient's cervix is located deep in vaginal canal and unable to be exposed with smaller (green clear) speculum. There is blood from  cervical os. Cervix is non erythematous and non-friable. There is nocervical motion tenderness. No masses or gross abnormalities appreciated during bimanual exam.  Pertinent Labs & Imaging:  Reviewed in chart and notable for the following: No recent DM/Lipid screening   Assessment & Plan:   Chronic non-seasonal allergic rhinitis Usually takes benadryl nearly nightly and flonase sometimes. Encouraged patient to use flonase nightly.   Hypertension Did not take HTN med this morning.   Continue to monitor  Healthcare maintenance PAP today - patient starting menstrual cycle on exam  Lipid profile and A1C given BMI   Generalized anxiety disorder Doing well on fluoxetine. No changes to meds today.    Health Maintenance Due  Topic Date Due  . HIV Screening  01/21/1997  . TETANUS/TDAP  01/21/2001  . PAP SMEAR-Modifier  02/24/2010  . INFLUENZA VACCINE  07/09/2018    Tetanus (~2-3 years ago) and flu vaccine (this year) done -- required for work and received at [facility]   Genia Hotter, M.D. [facility]  PGY -1 02/26/2019, 11:15 AM

## 2019-02-26 ENCOUNTER — Other Ambulatory Visit: Payer: Self-pay | Admitting: Family Medicine

## 2019-02-26 ENCOUNTER — Encounter: Payer: Self-pay | Admitting: Family Medicine

## 2019-02-26 DIAGNOSIS — J3089 Other allergic rhinitis: Secondary | ICD-10-CM | POA: Insufficient documentation

## 2019-02-26 DIAGNOSIS — F411 Generalized anxiety disorder: Secondary | ICD-10-CM | POA: Insufficient documentation

## 2019-02-26 DIAGNOSIS — E782 Mixed hyperlipidemia: Secondary | ICD-10-CM

## 2019-02-26 DIAGNOSIS — Z0000 Encounter for general adult medical examination without abnormal findings: Secondary | ICD-10-CM | POA: Insufficient documentation

## 2019-02-26 DIAGNOSIS — E669 Obesity, unspecified: Secondary | ICD-10-CM

## 2019-02-26 LAB — LIPID PANEL
CHOL/HDL RATIO: 3.8 ratio (ref 0.0–4.4)
Cholesterol, Total: 214 mg/dL — ABNORMAL HIGH (ref 100–199)
HDL: 56 mg/dL (ref >39)
LDL Calculated: 124 mg/dL — ABNORMAL HIGH (ref 0–99)
Triglycerides: 170 mg/dL — ABNORMAL HIGH (ref 0–149)
VLDL Cholesterol Cal: 34 mg/dL (ref 5–40)

## 2019-02-26 LAB — HIV ANTIBODY (ROUTINE TESTING W REFLEX): HIV SCREEN 4TH GENERATION: NONREACTIVE

## 2019-02-26 LAB — TSH: TSH: 1.02 u[IU]/mL (ref 0.450–4.500)

## 2019-02-26 NOTE — Assessment & Plan Note (Signed)
Doing well on fluoxetine. No changes to meds today.

## 2019-02-26 NOTE — Assessment & Plan Note (Signed)
Did not take HTN med this morning.   Continue to monitor

## 2019-02-26 NOTE — Assessment & Plan Note (Signed)
PAP today - patient starting menstrual cycle on exam  Lipid profile and A1C given BMI

## 2019-02-26 NOTE — Assessment & Plan Note (Signed)
Usually takes benadryl nearly nightly and flonase sometimes. Encouraged patient to use flonase nightly.

## 2019-02-26 NOTE — Assessment & Plan Note (Deleted)
Usually takes benadryl nearly nightly and flonase sometimes. Encouraged patient to use flonase nightly. Can also consider: topical corticosteroids such as nasonex, rhinocort, etc, topical antihistamine  if patient without relief. Patient reports that benadryl works well and will continue to use. She is aware of anticholinergic effects.

## 2019-03-01 LAB — CYTOLOGY - PAP
Diagnosis: NEGATIVE
HPV: NOT DETECTED

## 2019-03-02 ENCOUNTER — Telehealth: Payer: 59 | Admitting: Physician Assistant

## 2019-03-02 ENCOUNTER — Encounter: Payer: Self-pay | Admitting: Physician Assistant

## 2019-03-02 DIAGNOSIS — J069 Acute upper respiratory infection, unspecified: Secondary | ICD-10-CM | POA: Diagnosis not present

## 2019-03-02 DIAGNOSIS — J029 Acute pharyngitis, unspecified: Secondary | ICD-10-CM | POA: Diagnosis not present

## 2019-03-02 MED ORDER — BENZONATATE 100 MG PO CAPS: 100.0000 mg | ORAL_CAPSULE | Freq: Three times a day (TID) | ORAL | 0 refills | Status: DC | PRN

## 2019-03-02 MED ORDER — AMOXICILLIN 500 MG PO CAPS: 500.0000 mg | ORAL_CAPSULE | Freq: Two times a day (BID) | ORAL | 0 refills | Status: DC

## 2019-03-02 NOTE — Progress Notes (Signed)
We are sorry that you are not feeling well.  Here is how we plan to help!  Based on your presentation I believe you most likely have an upper respiratory infection.   In addition you may use A prescription cough medication called Tessalon Perles 100mg . You may take 1-2 capsules every 8 hours as needed for your cough.    You also have been diagnosed with acute pharyngitis due to your prolonged sore throat that is now worsening and associated with pain with swallowing. For this I have prescribed Amoxicillin 500 mg one pill twice daily for 10 day.    From your responses in the eVisit questionnaire you describe inflammation in the upper respiratory tract which is causing a significant cough.  This is commonly called Bronchitis and has four common causes:    Allergies  Viral Infections  Acid Reflux  Bacterial Infection Allergies, viruses and acid reflux are treated by controlling symptoms or eliminating the cause. An example might be a cough caused by taking certain blood pressure medications. You stop the cough by changing the medication. Another example might be a cough caused by acid reflux. Controlling the reflux helps control the cough.  USE OF BRONCHODILATOR ("RESCUE") INHALERS: There is a risk from using your bronchodilator too frequently.  The risk is that over-reliance on a medication which only relaxes the muscles surrounding the breathing tubes can reduce the effectiveness of medications prescribed to reduce swelling and congestion of the tubes themselves.  Although you feel brief relief from the bronchodilator inhaler, your asthma may actually be worsening with the tubes becoming more swollen and filled with mucus.  This can delay other crucial treatments, such as oral steroid medications. If you need to use a bronchodilator inhaler daily, several times per day, you should discuss this with your provider.  There are probably better treatments that could be used to keep your asthma under  control.     HOME CARE . Only take medications as instructed by your medical team. . Complete the entire course of an antibiotic. . Drink plenty of fluids and get plenty of rest. . Avoid close contacts especially the very young and the elderly . Cover your mouth if you cough or cough into your sleeve. . Always remember to wash your hands . A steam or ultrasonic humidifier can help congestion.   GET HELP RIGHT AWAY IF: . You develop worsening fever. . You become short of breath . You cough up blood. . Your symptoms persist after you have completed your treatment plan MAKE SURE YOU   Understand these instructions.  Will watch your condition.  Will get help right away if you are not doing well or get worse.  Your e-visit answers were reviewed by a board certified advanced clinical practitioner to complete your personal care plan.  Depending on the condition, your plan could have included both over the counter or prescription medications. If there is a problem please reply  once you have received a response from your provider. Your safety is important to [place].  If you have drug allergies check your prescription carefully.    You can use MyChart to ask questions about today's visit, request a non-urgent call back, or ask for a work or school excuse for 24 hours related to this e-Visit. If it has been greater than 24 hours you will need to follow up with your provider, or enter a new e-Visit to address those concerns. You will get an e-mail in the next two days  asking about your experience.  I hope that your e-visit has been valuable and will speed your recovery. Thank you for using e-visits.  I have spent 7 min in completion and review of this note- Illa Level [facility]

## 2019-03-03 MED FILL — HYDROCHLOROTHIAZIDE 25 MG T: 25 | 90 days supply | Qty: 90 | Fill #0

## 2019-03-08 ENCOUNTER — Encounter: Payer: Self-pay | Admitting: Family Medicine

## 2019-03-31 ENCOUNTER — Telehealth (INDEPENDENT_AMBULATORY_CARE_PROVIDER_SITE_OTHER): Payer: 59 | Admitting: Family Medicine

## 2019-03-31 ENCOUNTER — Other Ambulatory Visit: Payer: Self-pay

## 2019-03-31 DIAGNOSIS — F411 Generalized anxiety disorder: Secondary | ICD-10-CM

## 2019-03-31 MED ORDER — HYDROXYZINE HCL 10 MG PO TABS: 10.0000 mg | ORAL_TABLET | Freq: Three times a day (TID) | ORAL | 0 refills | Status: DC | PRN

## 2019-03-31 MED ORDER — BUPROPION HCL ER (XL) 150 MG PO TB24: 150.0000 mg | ORAL_TABLET | Freq: Every day | ORAL | 1 refills | Status: DC

## 2019-03-31 MED FILL — hydrOXYzine HCL 10 MG TABS: 10 | 30 days supply | Qty: 30 | Fill #0

## 2019-03-31 MED FILL — buPROPion HCL ER (XL) 150 M: 150 | 30 days supply | Qty: 30 | Fill #0

## 2019-03-31 NOTE — Progress Notes (Signed)
[facility] Telemedicine Visit  Patient consented to have virtual visit. Method of visit: Telephone  Encounter participants: Patient: Kristin Downs - located at [employer] Pharmacologist)  Provider: Joana Reamer - located at [facility] Others (if applicable): None  Chief Complaint: Anxiety Follow-up  HPI: Saw Dr. Selena Batten ~1 month ago with no changes in meds at that time. She notes having "more anxiety" lately, gets a "weird feeling on the back of her neck and across shoulders", feels "a dread feeling in her chest and just very anxious" but not sure what about. Comes out of no where. Denies any palpitations, chest tightness, tachypnea. Currently taking Fluoxetine 40mg  QD. Did feel like it was helping for a while, but doesn't feel like it is helping anymore. She notes that although the COVID situation has added extra stress, she notes that even before this she was beginning to struggle with worsening of her anxiety. She endorses that she was on Citalopram which also helped for a little bit but then stopped working as well. Denies current depressive symptoms. Denies any SI/HI.  ROS: per HPI  Pertinent PMHx: GAD  Exam:  General: calm and pleasant female  Respiratory: breathing comfortably, speaking in full sentences Psych:  Cognition and judgment appear intact. Alert, communicative  and cooperative with normal attention span and concentration.  Assessment/Plan:  Generalized anxiety disorder Some panic disorder component to her symptoms given rapid onset of dread and physiologic symptoms during episodes. She feels Fluoxetine is becoming ineffective as she has experienced more generalized anxiety lately. Discussed treatment options with patient including continuing Fluoxitine and adding a second agent such as Wellbutrin vs starting a different class of antidepressants such as an SNRI as she has now tried two SSRI's (Venlafaxine shown to be effective for both GAD and  PD).  Patient opted to add Wellbutrin at this time. According to Uptodate, recommended initial treatment includes Wellbutrin XL 150mg  QD. Discussed administration instructions and side effects. Patient to call clinic if experiences any adverse effects. Patient may need lower dose. Will also prescribe Atarax 10mg  TID PRN for immediate relief. Patient scheduled for follow-up telemedicine visit in 2 weeks.    Time spent during visit with patient: 25 minutes  Orpah Cobb, DO [facility], PGY1 03/31/2019

## 2019-04-01 NOTE — Assessment & Plan Note (Addendum)
Some panic disorder component to her symptoms given rapid onset of dread and physiologic symptoms during episodes. She feels Fluoxetine is becoming ineffective as she has experienced more generalized anxiety lately. Discussed treatment options with patient including continuing Fluoxitine and adding a second agent such as Wellbutrin vs starting a different class of antidepressants such as an SNRI as she has now tried two SSRI's (Venlafaxine shown to be effective for both GAD and PD).  Patient opted to add Wellbutrin at this time. According to Upt[employer], recommended initial treatment includes Wellbutrin XL 150mg  QD. Discussed administration instructions and side effects. Patient to call clinic if experiences any adverse effects. Patient may need lower dose. Will also prescribe Atarax 10mg  TID PRN for immediate relief. Patient scheduled for follow-up telemedicine visit in 2 weeks.

## 2019-04-06 ENCOUNTER — Other Ambulatory Visit: Payer: Self-pay | Admitting: Family Medicine

## 2019-04-06 DIAGNOSIS — F411 Generalized anxiety disorder: Secondary | ICD-10-CM

## 2019-04-10 IMAGING — DX DG FINGER RING 2+V*L*
3 series · 3 of 3 positions shown · non-contrast
Comparison: None

CLINICAL DATA: Smashed finger between 2 logs.

EXAM:
LEFT RING FINGER 2+V

[finger ap]
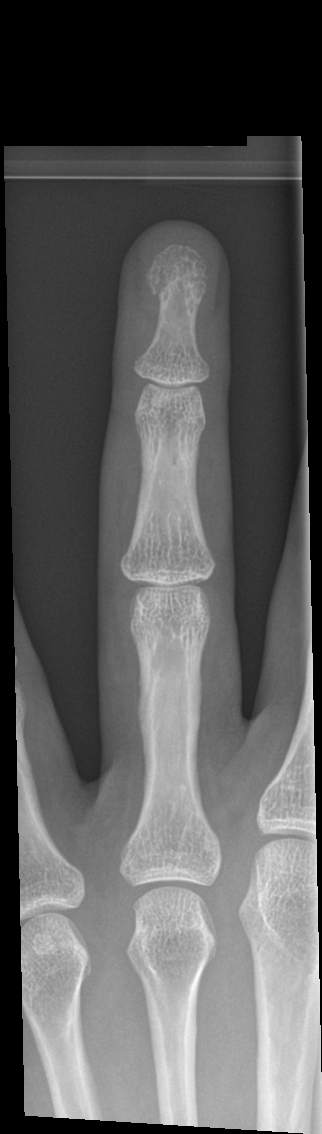

[finger obl]
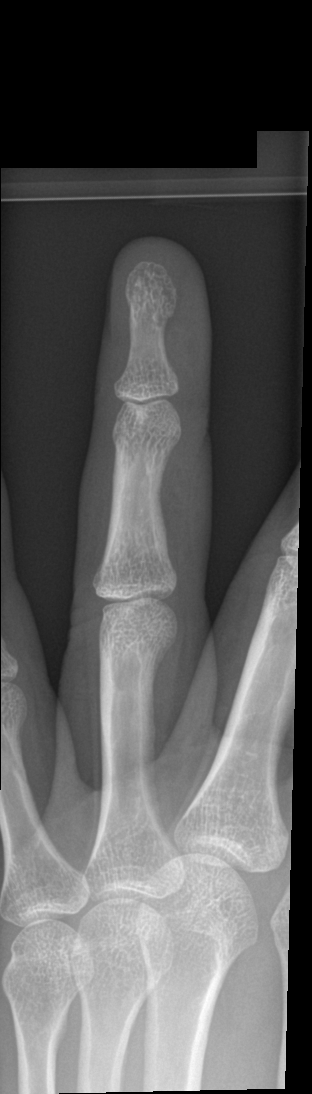

[finger lat]
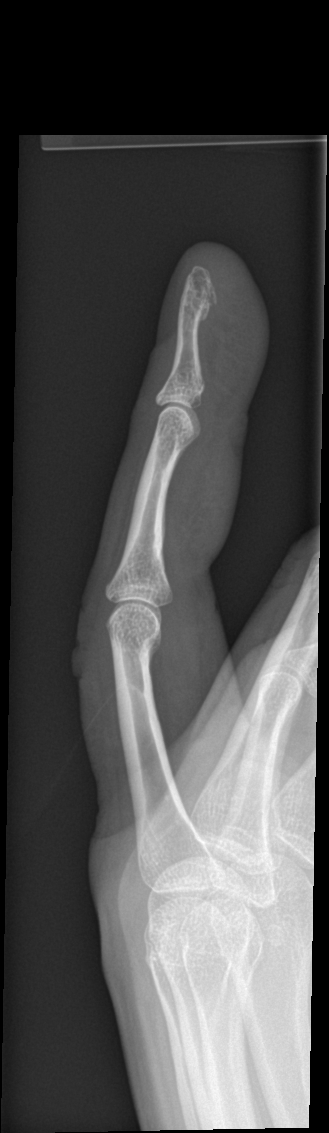

[3 of 3 positions shown; findings below may reference images not displayed]

FINDINGS: There is a nondisplaced fracture of the distal tuft. Joint spaces
are maintained. No radiopaque foreign body.
IMPRESSION: Nondisplaced distal tuft fracture of the ring finger.

## 2019-04-23 MED FILL — FLUoxetine HCL 40 MG CAPS: 40 | 90 days supply | Qty: 90 | Fill #0

## 2019-04-24 MED FILL — buPROPion HCL ER (XL) 150 M: 150 | 30 days supply | Qty: 30 | Fill #1

## 2019-04-28 DIAGNOSIS — E66811 Obesity, class 1: Secondary | ICD-10-CM | POA: Insufficient documentation

## 2019-04-28 DIAGNOSIS — E669 Obesity, unspecified: Secondary | ICD-10-CM | POA: Insufficient documentation

## 2019-05-01 IMAGING — DX DG FOOT COMPLETE 3+V*R*
3 series · 3 of 3 positions shown · non-contrast
Comparison: None.

CLINICAL DATA: Right foot pain

EXAM:
RIGHT FOOT COMPLETE - 3+ VIEW

[foot ap]
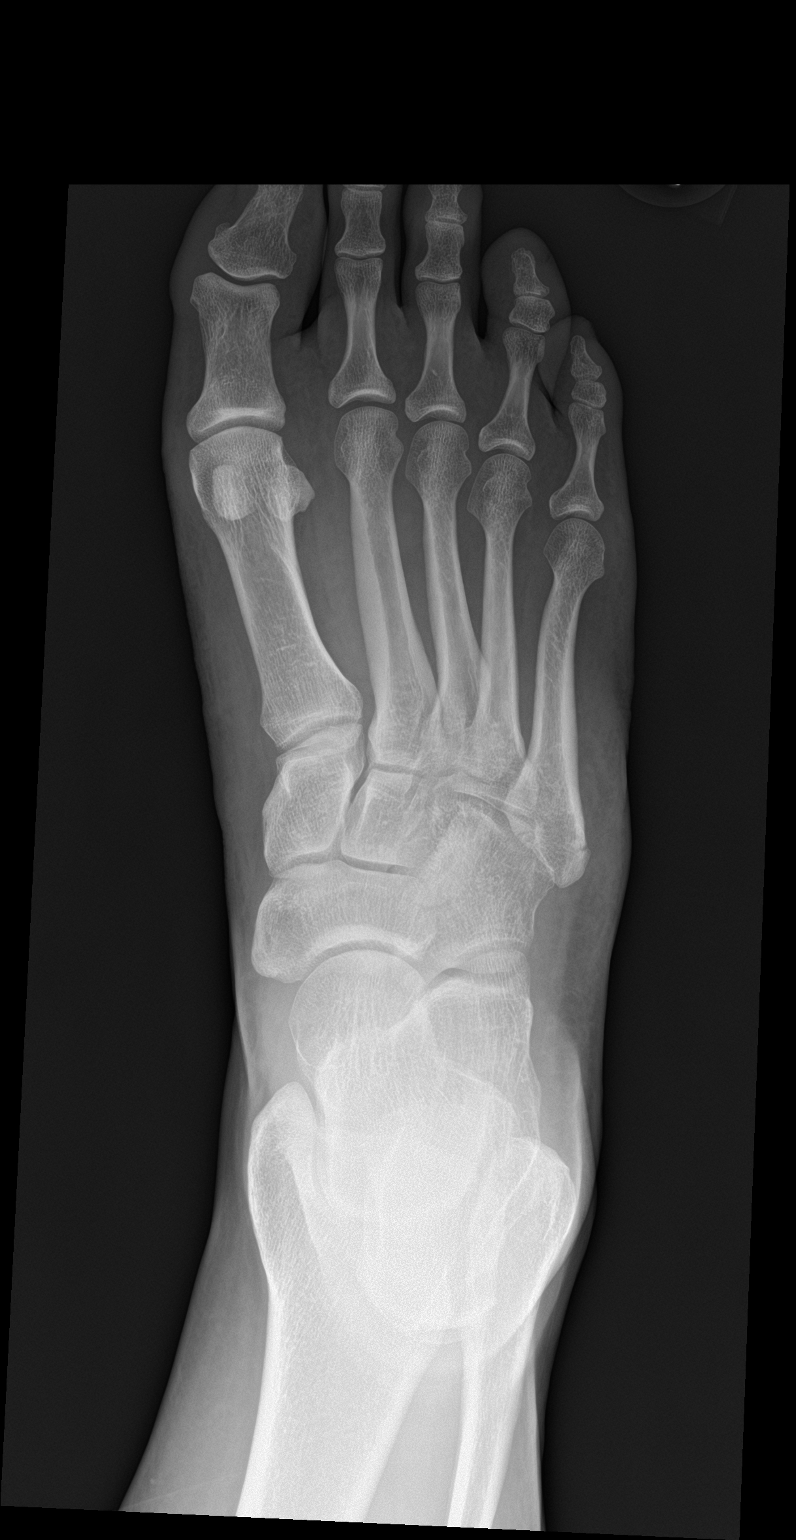

[foot obl]
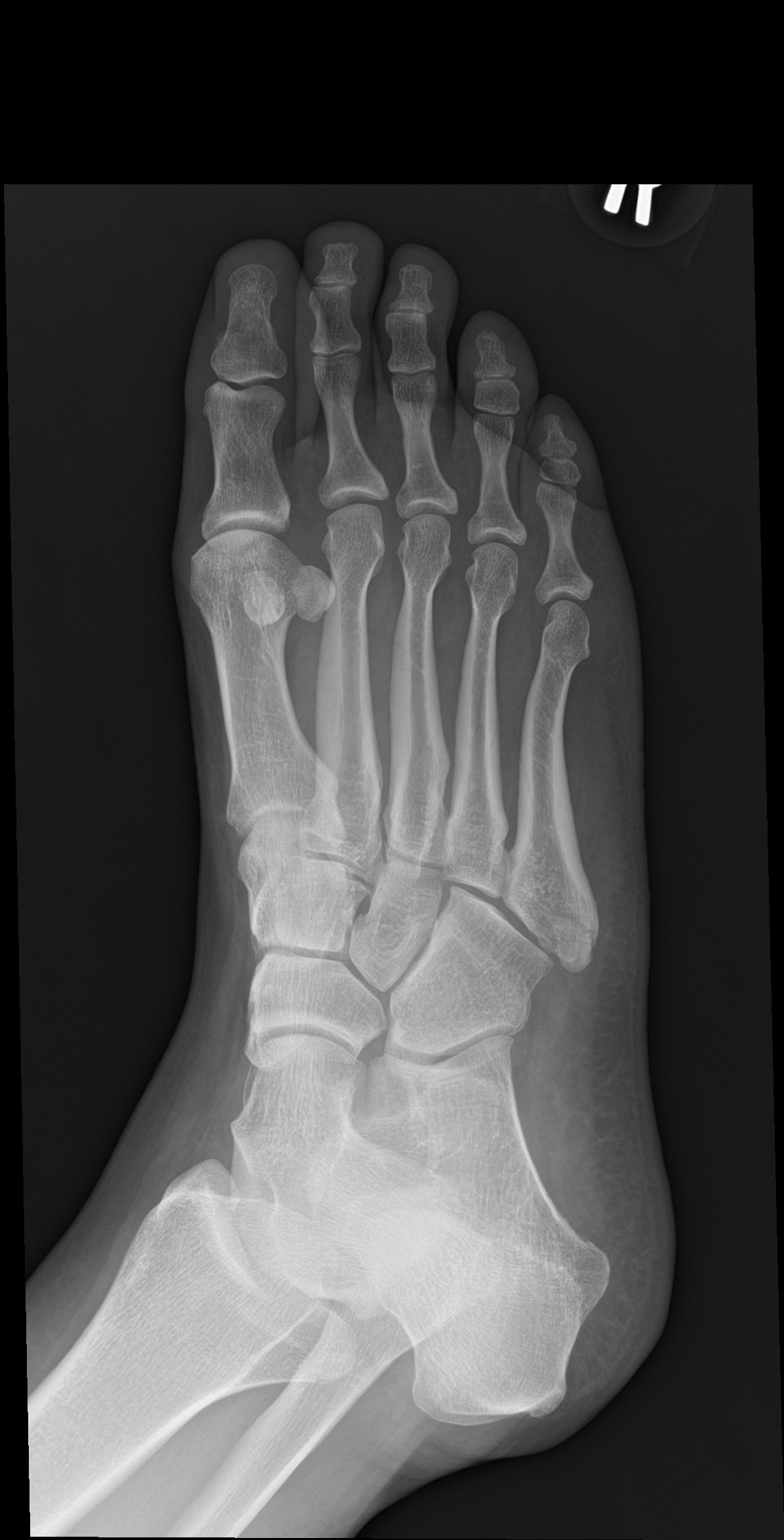

[foot lat]
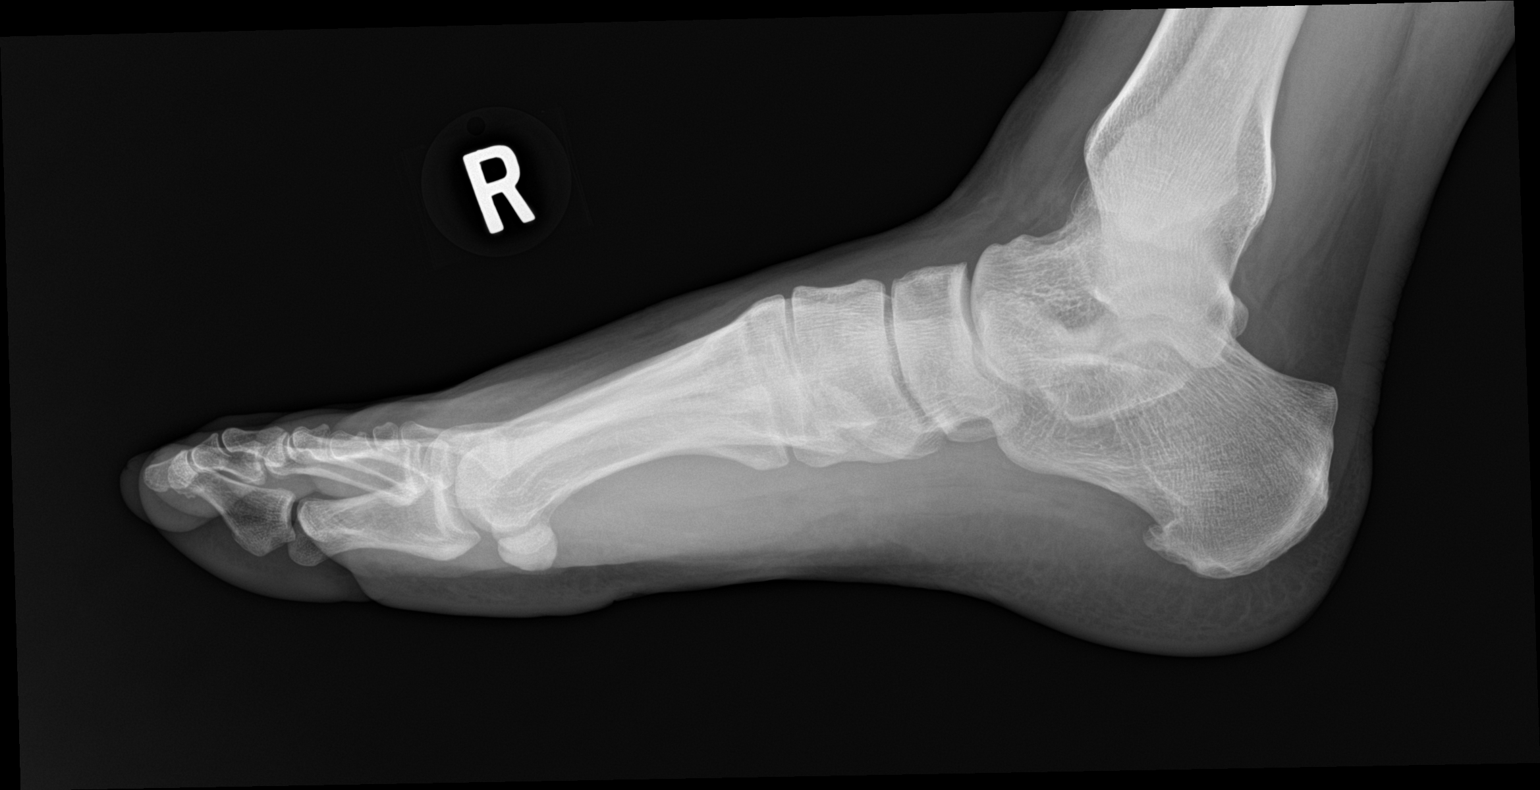

[3 of 3 positions shown; findings below may reference images not displayed]

FINDINGS: Nondisplaced transverse fracture of the base of the fifth metatarsal
extend to the cuboid metatarsal joint. No other fracture or
dislocation. Small plantar calcaneal spur.
IMPRESSION: Nondisplaced transverse fracture of the base of the fifth metatarsal
extend to the cuboid metatarsal joint.

## 2019-05-03 MED FILL — NORETHINDRONE 0.35 MG TAB: 0.35 | 84 days supply | Qty: 84 | Fill #0

## 2019-05-14 MED FILL — HYDROCHLOROTHIAZIDE 25 MG T: 25 | 90 days supply | Qty: 90 | Fill #1

## 2019-05-24 ENCOUNTER — Other Ambulatory Visit: Payer: Self-pay | Admitting: Family Medicine

## 2019-05-24 DIAGNOSIS — F411 Generalized anxiety disorder: Secondary | ICD-10-CM

## 2019-05-24 MED FILL — buPROPion HCL ER (XL) 150 M: 150 | 30 days supply | Qty: 30 | Fill #0

## 2019-06-17 MED FILL — buPROPion HCL ER (XL) 150 M: 150 | 30 days supply | Qty: 30 | Fill #1

## 2019-07-12 ENCOUNTER — Other Ambulatory Visit: Payer: Self-pay

## 2019-07-12 ENCOUNTER — Telehealth (INDEPENDENT_AMBULATORY_CARE_PROVIDER_SITE_OTHER): Payer: 59 | Admitting: Family Medicine

## 2019-07-12 DIAGNOSIS — F411 Generalized anxiety disorder: Secondary | ICD-10-CM

## 2019-07-12 MED ORDER — ESCITALOPRAM OXALATE 10 MG PO TABS: 10.0000 mg | ORAL_TABLET | Freq: Every day | ORAL | 0 refills | Status: DC

## 2019-07-12 MED ORDER — FLUOXETINE HCL 40 MG PO CAPS: ORAL_CAPSULE | ORAL | 0 refills | Status: DC

## 2019-07-12 NOTE — Progress Notes (Signed)
[facility] Telemedicine Visit  Patient consented to have virtual visit. Method of visit: Telephone  Encounter participants: Patient: Kristin Downs - located at home Provider: Bonnita Hollow - located at office Others (if applicable): None  Chief Complaint: Prozac side effect  HPI: Kristin Downs is a 38 y.o. female with past medical history significant for GAD who presents with concerns of side effect from her Prozac. Patient says that GAD is well controlled currently on Prozac 40 mg, Wellbutrin  XR 150 mg, Atarax 10 mg as needed.  Patient has been on Prozac for 4 to 5 years.  Wellbutrin was added on and April 2020.  Patient reports that she has a "drunk, hangover feeling in the evening."  This is been going on for the past month.  Patient wishes to go back to escitalopram which she was on before she was on the Prozac.  Discussed with patient her insight into current medications.  Told her that we do not ordinarily see patients getting a new side effects from medication after being on it without the side effect for several years.  Patient is insistent that she wants to try different medication.  Patient denies any SI/HI.  ROS: per HPI  Pertinent PMHx: GAD  Exam:  Respiratory: Able to speak full sentences without issue  Assessment/Plan: Medication change for GAD Patient wishing to stop fluoxetine due to perceived new side effects.  Given patient's been on this medication for a long time, I recommend slow taper, although withdrawal is less likely with fluoxetine over other SSRIs.  Then recommend slowly titrating escitalopram as needed for GAD control.  Continue Wellbutrin daily and Atarax as needed.  Patient follow-up in 3 weeks after she is titrated off fluoxetine and been on escitalopram for 1 week.  Time spent during visit with patient: 10 minutes

## 2019-07-16 MED FILL — FLUoxetine HCL 40 MG CAPS: 40 | 90 days supply | Qty: 90 | Fill #1

## 2019-07-17 ENCOUNTER — Other Ambulatory Visit: Payer: Self-pay | Admitting: Family Medicine

## 2019-07-17 DIAGNOSIS — F411 Generalized anxiety disorder: Secondary | ICD-10-CM

## 2019-07-19 MED FILL — buPROPion HCL ER (XL) 150 M: 150 | 90 days supply | Qty: 90 | Fill #0

## 2019-07-21 MED FILL — NORETHINDRONE 0.35 MG TAB: 0.35 | 84 days supply | Qty: 84 | Fill #1

## 2019-07-26 ENCOUNTER — Ambulatory Visit: Payer: 59 | Admitting: Family Medicine

## 2019-08-13 MED FILL — HYDROCHLOROTHIAZIDE 25 MG T: 25 | 90 days supply | Qty: 90 | Fill #2

## 2019-09-15 ENCOUNTER — Ambulatory Visit: Payer: Self-pay

## 2019-09-15 ENCOUNTER — Ambulatory Visit: Payer: 59 | Admitting: Podiatry

## 2019-09-15 ENCOUNTER — Other Ambulatory Visit: Payer: Self-pay

## 2019-09-15 ENCOUNTER — Encounter: Payer: Self-pay | Admitting: Podiatry

## 2019-09-15 DIAGNOSIS — M722 Plantar fascial fibromatosis: Secondary | ICD-10-CM | POA: Diagnosis not present

## 2019-09-20 NOTE — Progress Notes (Signed)
Subjective:   Patient ID: Kristin Downs, female   DOB: 38 y.o.   MRN: [id number]   HPI Patient presents stating she developed inflammation in the bottom of both heels and states that it is very sore and making it hard for her to walk.  Patient does not remember specific injury but it is been gradually getting worse over time   Review of Systems  All other systems reviewed and are negative.       Objective:  Physical Exam Vitals signs and nursing note reviewed.  Constitutional:      Appearance: She is well-developed.  Pulmonary:     Effort: Pulmonary effort is normal.  Musculoskeletal: Normal range of motion.  Skin:    General: Skin is warm.  Neurological:     Mental Status: She is alert.     Neurovascular status intact muscle strength found to be adequate range of motion within normal limits with patient found to have exquisite discomfort plantar aspect heel region bilateral with inflammation fluid of the medial band.  Patient is found to have good digital perfusion and is well oriented x2 with moderate depression of the arch      Assessment:  Acute plantar fasciitis bilateral with obesity and flatfoot deformity as part of the problem     Plan:  H&P conditions reviewed and today I went ahead and injected the plantar fascia bilateral 3 mg Kenalog 5 mg Xylocaine advised on supportive shoes and brace that she has at home placed on oral anti-inflammatory diclofenac and reappoint to recheck in 2 weeks  X-ray indicates that there is moderate depression of the arch bilateral small spur formation with no indication to stress fracture

## 2019-10-07 DIAGNOSIS — B079 Viral wart, unspecified: Secondary | ICD-10-CM | POA: Diagnosis not present

## 2019-10-07 MED FILL — IMIQUIMOD 5 % CREA: 5 | 30 days supply | Qty: 24 | Fill #0

## 2019-10-11 MED FILL — buPROPion HCL ER (XL) 150 M: 150 | 90 days supply | Qty: 90 | Fill #1

## 2019-10-12 MED FILL — NORETHINDRONE 0.35 MG TAB: 0.35 | 84 days supply | Qty: 84 | Fill #2

## 2019-11-12 MED FILL — HYDROCHLOROTHIAZIDE 25 MG T: 25 | 90 days supply | Qty: 90 | Fill #3

## 2019-12-14 ENCOUNTER — Other Ambulatory Visit: Payer: Self-pay | Admitting: Family Medicine

## 2019-12-14 DIAGNOSIS — F411 Generalized anxiety disorder: Secondary | ICD-10-CM

## 2019-12-16 MED FILL — hydrOXYzine HCL 10 MG TABS: 10 | 10 days supply | Qty: 30 | Fill #0

## 2019-12-31 MED FILL — IMIQUIMOD 5 % CREA: 5 | 30 days supply | Qty: 24 | Fill #1

## 2020-01-05 ENCOUNTER — Other Ambulatory Visit: Payer: Self-pay | Admitting: Family Medicine

## 2020-01-05 MED FILL — NORETHINDRONE 0.35 MG TAB: 0.35 | 84 days supply | Qty: 84 | Fill #0

## 2020-02-10 ENCOUNTER — Other Ambulatory Visit: Payer: Self-pay | Admitting: Family Medicine

## 2020-02-10 MED FILL — HYDROCHLOROTHIAZIDE 25 MG T: 25 | 90 days supply | Qty: 90 | Fill #0

## 2020-02-14 MED FILL — buPROPion HCL ER (XL) 150 M: 150 | 90 days supply | Qty: 90 | Fill #2

## 2020-03-10 ENCOUNTER — Ambulatory Visit (INDEPENDENT_AMBULATORY_CARE_PROVIDER_SITE_OTHER)
Admission: RE | Admit: 2020-03-10 | Discharge: 2020-03-10 | Disposition: A | Discharge status: Home / Self Care | Payer: 59 | Source: Ambulatory Visit

## 2020-03-10 DIAGNOSIS — R399 Unspecified symptoms and signs involving the genitourinary system: Secondary | ICD-10-CM | POA: Diagnosis not present

## 2020-03-10 DIAGNOSIS — Z87440 Personal history of urinary (tract) infections: Secondary | ICD-10-CM

## 2020-03-10 MED ORDER — CEPHALEXIN 500 MG PO CAPS: 500.0000 mg | ORAL_CAPSULE | Freq: Two times a day (BID) | ORAL | 0 refills | Status: DC

## 2020-03-10 NOTE — ED Provider Notes (Signed)
Virtual Visit via Video Note:  Kristin Downs  initiated request for Telemedicine visit with [facility] team. I connected with Kristin Downs  on 03/10/2020 at 5:37 PM  for a synchronized telemedicine visit using a video enabled HIPPA compliant telemedicine application. I verified that I am speaking with Kristin Downs  using two identifiers. Kristin Desaulniers Doree Fudge, PA-C  was physically located in a [facility] Urgent care site and Kristin Downs was located at a different location.   The limitations of evaluation and management by telemedicine as well as the availability of in-person appointments were discussed. Patient was informed that she  may incur a bill ( including co-pay) for this virtual visit encounter. Kristin Downs  expressed understanding and gave verbal consent to proceed with virtual visit.     History of Present Illness:Kristin Downs  is a 39 y.o. female presents with 4 day history of "bladder pain". Some soreness that is improved after voiding. Urinary frequency. No hematuria. No nausea/vomiting. Denies fever, chills, flank pain. Denies vaginal discharge, itching, spotting. LMP 02/21/2020. On OCP. Last BM this morning without straining.   Past Medical History:  Diagnosis Date  . Allergies 2000  . Hypertension 2019  . Migraines 2020    Allergies  Allergen Reactions  . Other Rash    Conch fish  . Prednisone Anxiety        Observations/Objective: General: Well appearing, nontoxic, no acute distress. Sitting comfortably. Head: Normocephalic, atraumatic Eye: No conjunctival injection, eyelid swelling. EOMI ENT: Mucus membranes moist, no lip cracking. No obvious nasal drainage. Pulm: Speaking in full sentences without difficulty. Normal effort. No respiratory distress, accessory muscle use. Abd: mild tenderness to self palpation of suprapubic area. No tenderness to self palpation lf RLQ/LLQ.  Neuro: Normal mental status. Alert and oriented x 3.  Assessment and  Plan: History of UTI, last needed abx about 1-2 years ago. We will treat clinically for cystitis based off symptoms.  Start Keflex as directed.  Push fluids.  Discussed with patient, if symptoms does not improve, or returns after course of antibiotics, will need to be seen in person.  Otherwise return precautions given.  Patient expresses understanding and agrees to plan.  Follow Up Instructions:    I discussed the assessment and treatment plan with the patient. The patient was provided an opportunity to ask questions and all were answered. The patient agreed with the plan and demonstrated an understanding of the instructions.   The patient was advised to call back or seek an in-person evaluation if the symptoms worsen or if the condition fails to improve as anticipated.  I provided 10 minutes of non-face-to-face time during this encounter.    Kristin Fisher, PA-C  03/10/2020 5:37 PM         Kristin Fisher, PA-C 03/10/20 1747

## 2020-03-10 NOTE — Discharge Instructions (Signed)
As discussed, treating you clinically for urinary tract infection based off of your symptoms.  Start Keflex as directed. Keep hydrated, urine should be clear to pale yellow in color.  If symptoms not improving, or returns after you finish your antibiotics, will need to be evaluated in person. Monitor for any worsening of symptoms, fever, worsening abdominal pain, nausea/vomiting, flank pain, follow up for reevaluation.   [facility] [place] [facility] [address], [place], [place] [address] [phone]  [facility] [place] [address], [place], [place] [address] [phone]  [facility] [place] [address] [place], [place] [address] [phone]  [facility] [place] [address], [address], , [place] [address] [phone]  [facility] [place] [address], [place], [place] [address] [phone]  [facility] [place] [address], [place], [place] [address] [phone]  

## 2020-03-23 MED FILL — NORETHINDRONE 0.35 MG TAB: 0.35 | 84 days supply | Qty: 84 | Fill #1

## 2020-05-04 MED FILL — HYDROCHLOROTHIAZIDE 25 MG T: 25 | 90 days supply | Qty: 90 | Fill #1

## 2020-05-08 MED FILL — buPROPion HCL ER (XL) 150 M: 150 | 90 days supply | Qty: 90 | Fill #3

## 2020-06-15 MED FILL — NORETHINDRONE 0.35 MG TAB: 0.35 | 84 days supply | Qty: 84 | Fill #2

## 2020-07-19 ENCOUNTER — Other Ambulatory Visit: Payer: Self-pay

## 2020-07-19 ENCOUNTER — Encounter: Payer: Self-pay | Admitting: Medical

## 2020-07-19 ENCOUNTER — Ambulatory Visit (INDEPENDENT_AMBULATORY_CARE_PROVIDER_SITE_OTHER): Payer: 59 | Admitting: Medical

## 2020-07-19 ENCOUNTER — Other Ambulatory Visit: Payer: Self-pay | Admitting: Medical

## 2020-07-19 VITALS — BP 133/89 | HR 84 | Ht 72.0 in | Wt 248.0 lb

## 2020-07-19 DIAGNOSIS — I10 Essential (primary) hypertension: Secondary | ICD-10-CM

## 2020-07-19 DIAGNOSIS — G43119 Migraine with aura, intractable, without status migrainosus: Secondary | ICD-10-CM

## 2020-07-19 DIAGNOSIS — N939 Abnormal uterine and vaginal bleeding, unspecified: Secondary | ICD-10-CM | POA: Diagnosis not present

## 2020-07-19 DIAGNOSIS — E669 Obesity, unspecified: Secondary | ICD-10-CM | POA: Diagnosis not present

## 2020-07-19 MED ORDER — NORGESTIMATE-ETH ESTRADIOL 0.25-35 MG-MCG PO TABS: 1.0000 | ORAL_TABLET | Freq: Every day | ORAL | 5 refills | Status: DC

## 2020-07-19 NOTE — Progress Notes (Signed)
   History:  Ms. Kristin Downs is a 39 y.o. G0P0000 who presents to clinic today to establish care and discuss birth control options and irregular periods on POPs. 7-8 months ago the patient was diagnosed with HTN. She was started on HCTZ 25 mg daily and changed from OCPs to POPs. Since starting POPs she has had irregular, heavy and prolonged periods. Periods will last up to 10 days and sometimes occur more than once a month. She had very regular, light, 3 days periods on OCPs. She has been on OCPs since 39 years old. She does not desire a pregnancy. She has since lost 30 lbs and has had no issues with BP management.    The following portions of the patient's history were reviewed and updated as appropriate: allergies, current medications, family history, past medical history, social history, past surgical history and problem list.  Review of Systems:  ROS    Objective:  Physical Exam BP 133/89   Pulse 84   Ht 6' (1.829 m)   Wt 248 lb (112.5 kg)   LMP 07/16/2020 (Exact Date)   BMI 33.63 kg/m  Physical Exam   Assessment & Plan:  1. Obesity (BMI 30.0-34.9) - Continue current weight loss regimen  2. Hypertension, unspecified type - Continue HCTZ as prescribed and monitor BP for changes with restart of OCPs  3. Intractable migraine with aura without status migrainosus - referred to [facility] for headache management with Nada Maclachlan, they will call her with an appointment   4. Abnormal uterine bleeding (AUB) - norgestimate-ethinyl estradiol (ORTHO-CYCLEN) 0.25-35 MG-MCG tablet; Take 1 tablet by mouth daily.  Dispense: 28 tablet; Refill: 5 - Patient to follow-up for BP check in 3 weeks and will monitor closely at home if symptoms warrant additional checks  - Discussed all forms of birth control and the risks and benefits of each. Patient will consider IUD. Pamphlet given.   Approximately 28 minutes of total time was spent with this patient on history taking, coordinating plan of  care, documentation and chart review.  Marny Lowenstein, PA-C 07/19/2020 4:20 PM

## 2020-07-19 NOTE — Patient Instructions (Signed)

## 2020-07-26 MED FILL — FEMYNOR 0.25-35 MG-MCG TABS: 0.25-35 | 28 days supply | Qty: 28 | Fill #0

## 2020-08-02 MED FILL — HYDROCHLOROTHIAZIDE 25 MG T: 25 | 90 days supply | Qty: 90 | Fill #2

## 2020-08-07 ENCOUNTER — Other Ambulatory Visit: Payer: Self-pay | Admitting: Family Medicine

## 2020-08-07 DIAGNOSIS — F411 Generalized anxiety disorder: Secondary | ICD-10-CM

## 2020-08-07 MED FILL — buPROPion HCL ER (XL) 150 M: 150 | 90 days supply | Qty: 90 | Fill #0

## 2020-08-07 NOTE — Telephone Encounter (Signed)
Please call patient and ask to schedule an annual appointment.

## 2020-08-08 ENCOUNTER — Other Ambulatory Visit: Payer: Self-pay | Admitting: Family Medicine

## 2020-08-08 DIAGNOSIS — F411 Generalized anxiety disorder: Secondary | ICD-10-CM

## 2020-08-10 ENCOUNTER — Encounter: Payer: Self-pay | Admitting: Medical

## 2020-08-10 ENCOUNTER — Telehealth (INDEPENDENT_AMBULATORY_CARE_PROVIDER_SITE_OTHER): Payer: 59 | Admitting: Medical

## 2020-08-10 VITALS — BP 123/89

## 2020-08-10 DIAGNOSIS — Z0130 Encounter for examination of blood pressure without abnormal findings: Secondary | ICD-10-CM

## 2020-08-10 NOTE — Progress Notes (Signed)
   History:  Ms. Kristin Downs is a 39 y.o. G0P0000 who presents for virtual blood pressure check today. She was seen last in the clinic ~ 1 month ago. She had newly diagnosed HTN by her PCP and was taken off OCPs and started on POPs. She was having breakthrough bleeding routinely on POPs and wanted to discuss other options. BP is well-controlled on HCTZ, so after counseling she decided to trial OCPs again and monitor BP. She has ability to monitor BP at home. She reports normal BP at home recently. She has had no breakthrough bleeding since starting back on the OCPs. She denies any headaches since last visit. She is scheduled to see our Nada Maclachlan at [facility] for history of migraines on 09/01/20.    The following portions of the patient's history were reviewed and updated as appropriate: allergies, current medications, family history, past medical history, social history, past surgical history and problem list.  Review of Systems:  Review of Systems  Constitutional: Negative for fever.  Eyes: Negative for blurred vision.  Gastrointestinal: Negative for abdominal pain.  Genitourinary:       Neg - vaginal bleeding  Neurological: Negative for headaches.      Objective:  Physical Exam BP 123/89   LMP 07/16/2020 (Exact Date)  Physical Exam Constitutional:      Appearance: Normal appearance.  Neurological:     Mental Status: She is alert.  Psychiatric:        Mood and Affect: Mood normal.        Behavior: Behavior normal.     Assessment & Plan:  1. BP check -Remote BP check is normal - Patient aware of symptoms to report that would indicate poorly controlled HTN - Continue OCPs  - Follow-up in 1 year for annual exam and refill of birth control or sooner PRN   Approximately 8 minutes of total time was spent with this patient on history taking, coordination of care and documentation.   Marny Lowenstein, PA-C 08/10/2020 4:25 PM

## 2020-08-10 NOTE — Progress Notes (Signed)
I connected with  Kristin Downs on 08/10/20 at  3:55 PM EDT by telephone and verified that I am speaking with the correct person using two identifiers.   I discussed the limitations, risks, security and privacy concerns of performing an evaluation and management service by telephone and the availability of in person appointments. I also discussed with the patient that there may be a patient responsible charge related to this service. The patient expressed understanding and agreed to proceed.  [name], RN 08/10/2020  4:03 PM

## 2020-08-10 NOTE — Patient Instructions (Signed)

## 2020-08-11 ENCOUNTER — Other Ambulatory Visit: Payer: Self-pay | Admitting: Family Medicine

## 2020-08-11 DIAGNOSIS — F411 Generalized anxiety disorder: Secondary | ICD-10-CM

## 2020-08-16 ENCOUNTER — Other Ambulatory Visit: Payer: Self-pay | Admitting: Family Medicine

## 2020-08-16 DIAGNOSIS — F411 Generalized anxiety disorder: Secondary | ICD-10-CM

## 2020-08-18 MED FILL — FEMYNOR 0.25-35 MG-MCG TABS: 0.25-35 | 84 days supply | Qty: 84 | Fill #1

## 2020-09-01 ENCOUNTER — Encounter: Payer: Self-pay | Admitting: Physician Assistant

## 2020-09-01 ENCOUNTER — Other Ambulatory Visit: Payer: Self-pay | Admitting: Physician Assistant

## 2020-09-01 ENCOUNTER — Other Ambulatory Visit: Payer: Self-pay

## 2020-09-01 ENCOUNTER — Ambulatory Visit: Payer: 59 | Admitting: Physician Assistant

## 2020-09-01 VITALS — BP 124/88 | HR 79 | Ht 72.0 in | Wt 242.0 lb

## 2020-09-01 DIAGNOSIS — G43009 Migraine without aura, not intractable, without status migrainosus: Secondary | ICD-10-CM | POA: Diagnosis not present

## 2020-09-01 MED ORDER — RIZATRIPTAN BENZOATE 10 MG PO TBDP: 10.0000 mg | ORAL_TABLET | ORAL | 3 refills | Status: DC | PRN

## 2020-09-01 MED ORDER — METOPROLOL SUCCINATE ER 25 MG PO TB24: 25.0000 mg | ORAL_TABLET | Freq: Every day | ORAL | 3 refills | Status: DC

## 2020-09-01 MED ORDER — ONDANSETRON 4 MG PO TBDP: 4.0000 mg | ORAL_TABLET | ORAL | 2 refills | Status: DC | PRN

## 2020-09-01 MED FILL — METOPROLOL SUCCINATE ER 25: 25 | 30 days supply | Qty: 30 | Fill #0

## 2020-09-01 MED FILL — ONDANSETRON ODT 4 MG TABLET: 4 | 20 days supply | Qty: 20 | Fill #0

## 2020-09-01 MED FILL — RIZATRIPTAN 10 MG ODT: 10 | 30 days supply | Qty: 12 | Fill #0

## 2020-09-01 NOTE — Progress Notes (Signed)
History:  Kristin Downs is a 39 y.o. G0P0000 who presents to clinic today for new eval of headache.  She has seen PCP and OB/GYN for migraines in the past.  She has used sumatriptan or injectable acute meds to treat them.  Her dad and both of her sisters also have migraine.  She has had them since she was 39 years old.  They were associated with menses.  Currently she gets them inconsistently.  She tends to take 800mg  of ibuprofen 3 times per week for headache.  Sumatriptan requires repeat dosing to work.  The headaches get to be severe.  They last up to 3 days.  There is nausea and vomiting.  She cannot use oral medication.  It usually starts around her left eye.  There is throbbing, light sensitivity, worse with movement, worse with noise.  Its described as a pulsing and throbbing.  There is no warning prior to the pain.  It becomes severe within 3-4 hours. She has lost 32# over the last 8 months by eating less overall and reducing carbs.   She does not exercise. She has very regular work/sleep/eating schedules. She has noted  HIT6:62 Number of days in the last 4 weeks with:  Severe headache: 5 Moderate headache: 0 Mild headache: 10  No headache: 0   Past Medical History:  Diagnosis Date  . Allergies 2000  . Hypertension 2019  . Migraines 2020    Social History   Socioeconomic History  . Marital status: Married    Spouse name: Not on file  . Number of children: Not on file  . Years of education: Not on file  . Highest education level: Not on file  Occupational History  . Not on file  Tobacco Use  . Smoking status: Never Smoker  . Smokeless tobacco: Never Used  Substance and Sexual Activity  . Alcohol use: Yes    Comment: 2 per week  . Drug use: Never  . Sexual activity: Yes    Birth control/protection: OCP    Comment: Also used for migraines   Other Topics Concern  . Not on file  Social History Narrative   Patient lives at home with husband, Randol and her dog Abby       Religious and personal beliefs: No blood, Jehovah's Witness   Does not exercise regularly, never tobacco user.  No drug use.  Yes alcohol use, beer liquor and wine approximately 2 times per week.  Low risk for STD exposure.  She does feel safe in her relationship.   For fun, she snowboards and camps.   Social Determinants of Health   Financial Resource Strain:   . Difficulty of Paying Living Expenses: Not on file  Food Insecurity:   . Worried About 2021 in the Last Year: Not on file  . Ran Out of Food in the Last Year: Not on file  Transportation Needs:   . Lack of Transportation (Medical): Not on file  . Lack of Transportation (Non-Medical): Not on file  Physical Activity:   . Days of Exercise per Week: Not on file  . Minutes of Exercise per Session: Not on file  Stress:   . Feeling of Stress : Not on file  Social Connections:   . Frequency of Communication with Friends and Family: Not on file  . Frequency of Social Gatherings with Friends and Family: Not on file  . Attends Religious Services: Not on file  . Active Member of Clubs or Organizations:  Not on file  . Attends Banker Meetings: Not on file  . Marital Status: Not on file  Intimate Partner Violence:   . Fear of Current or Ex-Partner: Not on file  . Emotionally Abused: Not on file  . Physically Abused: Not on file  . Sexually Abused: Not on file    Family History  Problem Relation Age of Onset  . Hypertension Mother   . Hypertension Father   . High Cholesterol Father   . Kidney cancer Father 75       2015, incidental finding  . Mood Disorder Sister   . Migraines Sister   . Migraines Brother   . Alzheimer's disease Maternal Grandmother   . Alzheimer's disease Maternal Grandfather   . Heart disease Paternal Grandmother   . Migraines Sister     Allergies  Allergen Reactions  . Other Rash    Conch fish  . Prednisone Anxiety    Current Outpatient Medications on File Prior to  Visit  Medication Sig Dispense Refill  . buPROPion (WELLBUTRIN XL) 150 MG 24 hr tablet TAKE 1 TABLET BY MOUTH DAILY. 90 tablet 3  . diphenhydrAMINE (BENADRYL) 50 MG tablet Take 50 mg by mouth at bedtime as needed for allergies.     . hydrochlorothiazide (HYDRODIURIL) 25 MG tablet TAKE 1 TABLET (25 MG TOTAL) BY MOUTH DAILY. 90 tablet 3  . hydrOXYzine (ATARAX/VISTARIL) 10 MG tablet TAKE 1 TABLET (10 MG TOTAL) BY MOUTH 3 (THREE) TIMES DAILY AS NEEDED FOR ANXIETY. 30 tablet 0  . Multiple Vitamins-Minerals (MULTIVITAMIN WITH MINERALS) tablet Take 1 tablet by mouth daily.    . norgestimate-ethinyl estradiol (ORTHO-CYCLEN) 0.25-35 MG-MCG tablet Take 1 tablet by mouth daily. 28 tablet 5  . ondansetron (ZOFRAN-ODT) 4 MG disintegrating tablet Take by mouth.    . [DISCONTINUED] escitalopram (LEXAPRO) 10 MG tablet Take 1 tablet (10 mg total) by mouth daily. 30 tablet 0  . [DISCONTINUED] FLUoxetine (PROZAC) 40 MG capsule Take every other day for one week. Then take every three days for 1 week. Then discontinue. 7 capsule 0  . [DISCONTINUED] fluticasone (FLONASE) 50 MCG/ACT nasal spray Place 2 sprays into both nostrils daily. 16 g 6   No current facility-administered medications on file prior to visit.     Review of Systems:  All pertinent positive/negative included in HPI, all other review of systems are negative   Objective:  Physical Exam BP 124/88   Pulse 79   Ht 6' (1.829 m)   Wt 242 lb (109.8 kg)   BMI 32.82 kg/m  CONSTITUTIONAL: Well-developed, well-nourished female in no acute distress.  EYES: EOM intact ENT: Normocephalic CARDIOVASCULAR: Regular rate  RESPIRATORY: Normal rate.  MUSCULOSKELETAL: Normal ROM SKIN: Warm, dry without erythema  NEUROLOGICAL: Alert, oriented, CN II-XII grossly intact, Appropriate balance and gait PSYCH: Normal behavior, mood   Assessment & Plan:  Assessment: No suggestion of aura. This should not be in her problem list. Okay to continue estrogen OCP 1.  Migraine without aura and without status migrainosus, not intractable       Plan: Exercise - add aerobic exercise 3 times weekly minimum Begin metoprolol 25mg  daily for migraine prevention Okay to continue HCTZ.  Check blood pressure and report back if concerns.   I have changed acute medication to Rizatriptan 10mg  mlt.  Not to be used within 24 hours of sumatriptan.  I have provided a sample of Nurtec to trial for acute migraine.  We can utilize this medication if needed.  No rx  given today.  Continue Zofran prn nausea/vomiting. Pt encouraged to keep a food diary to identify triggers  Follow-up in 3 months or sooner PRN  Bertram Denver, PA-C 09/01/2020 9:02 AM

## 2020-09-01 NOTE — Patient Instructions (Addendum)
Exercise - add aerobic exercise 3 times weekly minimum Begin metoprolol 25mg  daily for migraine prevention Okay to continue HCTZ.  Check blood pressure and report back if concerns.   I have changed acute medication to Rizatriptan 10mg  mlt.  Not to be used within 24 hours of sumatriptan.  I have provided a sample of Nurtec to trial for acute migraine.  We can utilize this medication if needed.  No rx given today.  Pt encouraged to keep a food diary to identify triggers  Follow-up in 3 months or sooner PRN     Migraine Headache A migraine headache is an intense, throbbing pain on one side or both sides of the head. Migraine headaches may also cause other symptoms, such as nausea, vomiting, and sensitivity to light and noise. A migraine headache can last from 4 hours to 3 days. Talk with your doctor about what things may bring on (trigger) your migraine headaches. What are the causes? The exact cause of this condition is not known. However, a migraine may be caused when nerves in the brain become irritated and release chemicals that cause inflammation of blood vessels. This inflammation causes pain. This condition may be triggered or caused by:  Drinking alcohol.  Smoking.  Taking medicines, such as: ? Medicine used to treat chest pain (nitroglycerin). ? Birth control pills. ? Estrogen. ? Certain blood pressure medicines.  Eating or drinking products that contain nitrates, glutamate, aspartame, or tyramine. Aged cheeses, chocolate, or caffeine may also be triggers.  Doing physical activity. Other things that may trigger a migraine headache include:  Menstruation.  Pregnancy.  Hunger.  Stress.  Lack of sleep or too much sleep.  Weather changes.  Fatigue. What increases the risk? The following factors may make you more likely to experience migraine headaches:  Being a certain age. This condition is more common in people who are 31-56 years old.  Being female.  Having a  family history of migraine headaches.  Being Caucasian.  Having a mental health condition, such as depression or anxiety.  Being obese. What are the signs or symptoms? The main symptom of this condition is pulsating or throbbing pain. This pain may:  Happen in any area of the head, such as on one side or both sides.  Interfere with daily activities.  Get worse with physical activity.  Get worse with exposure to bright lights or loud noises. Other symptoms may include:  Nausea.  Vomiting.  Dizziness.  General sensitivity to bright lights, loud noises, or smells. Before you get a migraine headache, you may get warning signs (an aura). An aura may include:  Seeing flashing lights or having blind spots.  Seeing bright spots, halos, or zigzag lines.  Having tunnel vision or blurred vision.  Having numbness or a tingling feeling.  Having trouble talking.  Having muscle weakness. Some people have symptoms after a migraine headache (postdromal phase), such as:  Feeling tired.  Difficulty concentrating. How is this diagnosed? A migraine headache can be diagnosed based on:  Your symptoms.  A physical exam.  Tests, such as: ? CT scan or an MRI of the head. These imaging tests can help rule out other causes of headaches. ? Taking fluid from the spine (lumbar puncture) and analyzing it (cerebrospinal fluid analysis, or CSF analysis). How is this treated? This condition may be treated with medicines that:  Relieve pain.  Relieve nausea.  Prevent migraine headaches. Treatment for this condition may also include:  Acupuncture.  Lifestyle changes like avoiding  foods that trigger migraine headaches.  Biofeedback.  Cognitive behavioral therapy. Follow these instructions at home: Medicines  Take over-the-counter and prescription medicines only as told by your health care provider.  Ask your health care provider if the medicine prescribed to you: ? Requires you  to avoid driving or using heavy machinery. ? Can cause constipation. You may need to take these actions to prevent or treat constipation:  Drink enough fluid to keep your urine pale yellow.  Take over-the-counter or prescription medicines.  Eat foods that are high in fiber, such as beans, whole grains, and fresh fruits and vegetables.  Limit foods that are high in fat and processed sugars, such as fried or sweet foods. Lifestyle  Do not drink alcohol.  Do not use any products that contain nicotine or tobacco, such as cigarettes, e-cigarettes, and chewing tobacco. If you need help quitting, ask your health care provider.  Get at least 8 hours of sleep every night.  Find ways to manage stress, such as meditation, deep breathing, or yoga. General instructions      Keep a journal to find out what may trigger your migraine headaches. For example, write down: ? What you eat and drink. ? How much sleep you get. ? Any change to your diet or medicines.  If you have a migraine headache: ? Avoid things that make your symptoms worse, such as bright lights. ? It may help to lie down in a dark, quiet room. ? Do not drive or use heavy machinery. ? Ask your health care provider what activities are safe for you while you are experiencing symptoms.  Keep all follow-up visits as told by your health care provider. This is important. Contact a health care provider if:  You develop symptoms that are different or more severe than your usual migraine headache symptoms.  You have more than 15 headache days in one month. Get help right away if:  Your migraine headache becomes severe.  Your migraine headache lasts longer than 72 hours.  You have a fever.  You have a stiff neck.  You have vision loss.  Your muscles feel weak or like you cannot control them.  You start to lose your balance often.  You have trouble walking.  You faint.  You have a seizure. Summary  A migraine  headache is an intense, throbbing pain on one side or both sides of the head. Migraines may also cause other symptoms, such as nausea, vomiting, and sensitivity to light and noise.  This condition may be treated with medicines and lifestyle changes. You may also need to avoid certain things that trigger a migraine headache.  Keep a journal to find out what may trigger your migraine headaches.  Contact your health care provider if you have more than 15 headache days in a month or you develop symptoms that are different or more severe than your usual migraine headache symptoms. This information is not intended to replace advice given to you by your health care provider. Make sure you discuss any questions you have with your health care provider. Document Revised: 03/19/2019 Document Reviewed: 01/07/2019 Elsevier Patient Education  2020 [employer].

## 2020-09-06 ENCOUNTER — Ambulatory Visit (INDEPENDENT_AMBULATORY_CARE_PROVIDER_SITE_OTHER): Payer: 59 | Admitting: Family Medicine

## 2020-09-06 ENCOUNTER — Other Ambulatory Visit: Payer: Self-pay

## 2020-09-06 ENCOUNTER — Encounter: Payer: Self-pay | Admitting: Family Medicine

## 2020-09-06 VITALS — BP 112/86 | HR 68 | Ht 72.0 in | Wt 246.2 lb

## 2020-09-06 DIAGNOSIS — E1169 Type 2 diabetes mellitus with other specified complication: Secondary | ICD-10-CM | POA: Diagnosis not present

## 2020-09-06 DIAGNOSIS — E785 Hyperlipidemia, unspecified: Secondary | ICD-10-CM

## 2020-09-06 DIAGNOSIS — Z111 Encounter for screening for respiratory tuberculosis: Secondary | ICD-10-CM | POA: Diagnosis not present

## 2020-09-06 DIAGNOSIS — E669 Obesity, unspecified: Secondary | ICD-10-CM

## 2020-09-06 DIAGNOSIS — Z1159 Encounter for screening for other viral diseases: Secondary | ICD-10-CM | POA: Diagnosis not present

## 2020-09-06 NOTE — Progress Notes (Signed)
    SUBJECTIVE:   Chief compliant/HPI: annual examination  Kristin Downs is a 39 y.o. who presents today for an annual exam.   Review of systems form notable for none.   Updated history tabs and problem list.   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   Migraines/Hypertension  Holding HCTZ and substituting BB after visit with provider last week.  Lost 30 lbs since 02/2019  OBJECTIVE:   BP 112/86   Pulse 68   Ht 6' (1.829 m)   Wt 246 lb 3.2 oz (111.7 kg)   LMP 08/16/2020 (Approximate)   SpO2 100%   BMI 33.39 kg/m    Gen: NAD, alert, non-toxic, well-nourished, well-appearing, pleasant HEENT: Normocephaic, atraumatic. PERRLA, clear conjuctiva, no scleral icterus and injection. Normal EOM.  Hearing intact. TM pearly grey bilaterally with no fluid.  Neck supple with no LAD, nodules, or gross abnormality.  Nares patent with no discharge.  Maxillary and frontal sinuses nontender to palpation.  Oropharynx without erythema and lesions.  Tonsils nonswollen and without exudate.   CV: Regular rate and rhythm.  Normal S1-S2.  No murmur, gallops, S3, S4 appreciated.  Normal capillary refill bilaterally.  Radial pulses 2+ bilaterally. No bilateral lower extremity edema. Resp: Clear to auscultation bilaterally.  No wheezing, rales, rhonchi, or other abnormal lung sounds.  No increased work of breathing appreciated. Abd: Nontender and nondistended on palpation to all 4 quadrants.  Positive bowel sounds. Skin: No obvious rashes, lesions, or trauma.  Normal turgor.  MSK: Normal ROM. Normal strength and tone.  Neuro: Cranial nerves II through VI grossly intact. Gait normal.  Alert and oriented x4.  No obvious abnormal movements. Psych: Cooperative with exam.  Normal speech. Pleasant. Makes good eye contact. Genitourinary: deferred.   ASSESSMENT/PLAN:   Obesity (BMI 30.0-34.9) 30 lb weight loss from last year. Patient congratulated and encouraged  to continue healthy lifestyle habits.   Annual Examination  See AVS for age appropriate recommendations.   PHQ score 2, reviewed and discussed. Blood pressure reviewed and at goal.  Asked about intimate partner violence and patient reports none.  The patient currently uses OCP for contraception. Folate recommended as appropriate, minimum of 400 mcg per day. Recently changed to orthotricyclen lo due to menorrhagia. BP has been stable good weight loss trend. Advanced directives. Has DPA and scanned in to chart already   Considered the following items based upon USPSTF recommendations: HIV testing: discussed (02/25/19) Hepatitis C: discussed and ordered Hepatitis B: discussed and not ordered Syphilis if at high risk: discussed and not ordered GC/CT not at high risk and not ordered. Lipid panel (nonfasting or fasting) discussed based upon AHA recommendations and ordered.  Consider repeat every 4-6 years. Nonfasting today.  Reviewed risk factors for latent tuberculosis and QFT today for school  Discussed family history, BRCA testing declined.  Cervical cancer screening: prior Pap reviewed, repeat due in 4 years Immunizations UTD   Follow up in 1  year or sooner if indicated.  Forms will be available for pick up once QFT results.    Wilber Oliphant, MD [place]

## 2020-09-07 ENCOUNTER — Other Ambulatory Visit: Payer: Self-pay | Admitting: Family Medicine

## 2020-09-07 NOTE — Assessment & Plan Note (Signed)
30 lb weight loss from last year. Patient congratulated and encouraged to continue healthy lifestyle habits.

## 2020-09-09 LAB — LIPID PANEL
Chol/HDL Ratio: 3.2 ratio (ref 0.0–4.4)
Cholesterol, Total: 165 mg/dL (ref 100–199)
HDL: 51 mg/dL (ref >39)
LDL Chol Calc (NIH): 78 mg/dL (ref 0–99)
Triglycerides: 220 mg/dL — ABNORMAL HIGH (ref 0–149)
VLDL Cholesterol Cal: 36 mg/dL (ref 5–40)

## 2020-09-09 LAB — QUANTIFERON-TB GOLD PLUS
QuantiFERON Mitogen Value: >10 IU/mL
QuantiFERON Nil Value: 0.02 IU/mL
QuantiFERON TB1 Ag Value: 0.2 IU/mL
QuantiFERON TB2 Ag Value: 0.07 IU/mL
QuantiFERON-TB Gold Plus: NEGATIVE

## 2020-09-09 LAB — HEPATITIS C ANTIBODY: Hep C Virus Ab: <0.1 s/co ratio (ref 0.0–0.9)

## 2020-09-11 ENCOUNTER — Encounter: Payer: Self-pay | Admitting: Family Medicine

## 2020-09-11 ENCOUNTER — Ambulatory Visit (INDEPENDENT_AMBULATORY_CARE_PROVIDER_SITE_OTHER): Payer: 59 | Admitting: Family Medicine

## 2020-09-11 ENCOUNTER — Other Ambulatory Visit: Payer: Self-pay

## 2020-09-11 DIAGNOSIS — M79601 Pain in right arm: Secondary | ICD-10-CM

## 2020-09-11 HISTORY — DX: Pain in right arm: M79.601

## 2020-09-11 NOTE — Patient Instructions (Signed)
This is almost certainly a vigorous immune reaction to the third vaccine.   I want to keep my eye on you to make sure it is not a bacterial infection.  I wouuld want to do more tests/treatment if: The arm redness, pain and swelling keeps getting  Worse. If you have fever of other systemic symptoms. Call me/us immediately if any worrisome symptoms.  I would want to treat you with antibiotics promptly if those symptoms develop.

## 2020-09-11 NOTE — Assessment & Plan Note (Signed)
Almost certainly an exuberant immune response to 3rd COVID vaccination.  My small worry is that she could have an early bacterial infection.  If arm swelling worsens, will need an untrasound to RO abscess. No antibiotics for now.  Strict follow up precautions.  I do not think this is an allergic reaction to the vaccine.

## 2020-09-11 NOTE — Progress Notes (Signed)
    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  Had COVID booster Friday with at-risk job being the indication.   Patient has both local and systemic sx: Local: pain, swelling and redness.  She has Rxed with ice and benadryl.  Not improving.  Today, swelling may be worse. Systemic: Had fever and flu like sx until yesterday.  Today no systemic sx.  Of note, she had noticible but not as severe reaction to 2nd COVID vaccine   OBJECTIVE:   BP 108/68   Pulse 73   Ht 6' (1.829 m)   Wt 243 lb (110.2 kg)   LMP 08/16/2020 (Approximate)   SpO2 99%   BMI 32.96 kg/m   Right arm with significant swelling and blotchy redness over deltoid location of injection.   No raised, active border suggestive of cellulitis.  ASSESSMENT/PLAN:   Right arm pain Almost certainly an exuberant immune response to 3rd COVID vaccination.  My small worry is that she could have an early bacterial infection.  If arm swelling worsens, will need an untrasound to RO abscess. No antibiotics for now.  Strict follow up precautions.  I do not think this is an allergic reaction to the vaccine.      Moses Manners, MD [facility]

## 2020-10-02 MED FILL — METOPROLOL SUCCINATE ER 25: 25 | 90 days supply | Qty: 90 | Fill #1

## 2020-11-01 MED FILL — FEMYNOR 0.25-35 MG-MCG TABS: 0.25-35 | 56 days supply | Qty: 56 | Fill #2

## 2020-11-13 ENCOUNTER — Other Ambulatory Visit: Payer: Self-pay | Admitting: Medical

## 2020-11-13 DIAGNOSIS — G43119 Migraine with aura, intractable, without status migrainosus: Secondary | ICD-10-CM

## 2020-11-13 MED FILL — buPROPion HCL ER (XL) 150 M: 150 | 90 days supply | Qty: 90 | Fill #0

## 2020-11-24 ENCOUNTER — Other Ambulatory Visit: Payer: Self-pay

## 2020-11-24 ENCOUNTER — Other Ambulatory Visit: Payer: Self-pay | Admitting: Physician Assistant

## 2020-11-24 ENCOUNTER — Ambulatory Visit: Payer: 59 | Admitting: Physician Assistant

## 2020-11-24 ENCOUNTER — Encounter: Payer: Self-pay | Admitting: Physician Assistant

## 2020-11-24 VITALS — BP 106/71 | HR 73

## 2020-11-24 DIAGNOSIS — G43009 Migraine without aura, not intractable, without status migrainosus: Secondary | ICD-10-CM

## 2020-11-24 MED ORDER — METOPROLOL SUCCINATE ER 25 MG PO TB24: 25.0000 mg | ORAL_TABLET | Freq: Every day | ORAL | 3 refills | Status: DC

## 2020-11-24 NOTE — Progress Notes (Signed)
History:  Kristin Downs is a 39 y.o. G0P0000 who presents to clinic today for migraine.  She has had good results from metoprolol XL 25mg  with fewer headaches.  She has used maxalt once and that worked well.  She utilizes ibuprofen as well and notes it has worked now that her headaches are better overall.  She has started walking for exercise and has cut out all alcohol in her work toward weight loss. She has not tried Nurtec and reports she didn't need it.   Number of days in the last 4 weeks with:  Severe headache: 2 Moderate headache: 0 Mild headache: 8  No headache: 18   Past Medical History:  Diagnosis Date  . Allergies 2000  . Hypertension 2019  . Migraines 2020    Social History   Socioeconomic History  . Marital status: Married    Spouse name: Not on file  . Number of children: Not on file  . Years of education: Not on file  . Highest education level: Not on file  Occupational History  . Not on file  Tobacco Use  . Smoking status: Never Smoker  . Smokeless tobacco: Never Used  Substance and Sexual Activity  . Alcohol use: Yes    Comment: 2 per week  . Drug use: Never  . Sexual activity: Yes    Birth control/protection: OCP    Comment: Also used for migraines   Other Topics Concern  . Not on file  Social History Narrative   Patient lives at home with husband, Randol and her dog Abby      Religious and personal beliefs: No blood, Jehovah's Witness   Does not exercise regularly, never tobacco user.  No drug use.  Yes alcohol use, beer liquor and wine approximately 2 times per week.  Low risk for STD exposure.  She does feel safe in her relationship.   For fun, she snowboards and camps.   Social Determinants of Health   Financial Resource Strain: Not on file  Food Insecurity: Not on file  Transportation Needs: Not on file  Physical Activity: Not on file  Stress: Not on file  Social Connections: Not on file  Intimate Partner Violence: Not on file    Family  History  Problem Relation Age of Onset  . Hypertension Mother   . Hypertension Father   . High Cholesterol Father   . Kidney cancer Father 85       2015, incidental finding  . Mood Disorder Sister   . Migraines Sister   . Migraines Brother   . Alzheimer's disease Maternal Grandmother   . Alzheimer's disease Maternal Grandfather   . Heart disease Paternal Grandmother   . Migraines Sister     Allergies  Allergen Reactions  . Other Rash    Conch fish  . Prednisone Anxiety    Current Outpatient Medications on File Prior to Visit  Medication Sig Dispense Refill  . buPROPion (WELLBUTRIN XL) 150 MG 24 hr tablet TAKE 1 TABLET BY MOUTH DAILY. 90 tablet 3  . diphenhydrAMINE (BENADRYL) 50 MG tablet Take 50 mg by mouth at bedtime as needed for allergies.     . hydrOXYzine (ATARAX/VISTARIL) 10 MG tablet TAKE 1 TABLET (10 MG TOTAL) BY MOUTH 3 (THREE) TIMES DAILY AS NEEDED FOR ANXIETY. 30 tablet 0  . metoprolol succinate (TOPROL XL) 25 MG 24 hr tablet Take 1 tablet (25 mg total) by mouth daily. 30 tablet 3  . Multiple Vitamins-Minerals (MULTIVITAMIN WITH MINERALS) tablet Take 1  tablet by mouth daily.    . norgestimate-ethinyl estradiol (ORTHO-CYCLEN) 0.25-35 MG-MCG tablet Take 1 tablet by mouth daily. 28 tablet 5  . ondansetron (ZOFRAN-ODT) 4 MG disintegrating tablet Take 1 tablet (4 mg total) by mouth as needed. 20 tablet 2  . rizatriptan (MAXALT-MLT) 10 MG disintegrating tablet Take 1 tablet (10 mg total) by mouth as needed for migraine. May repeat in 2 hours if needed 12 tablet 3  . [DISCONTINUED] escitalopram (LEXAPRO) 10 MG tablet Take 1 tablet (10 mg total) by mouth daily. 30 tablet 0  . [DISCONTINUED] FLUoxetine (PROZAC) 40 MG capsule Take every other day for one week. Then take every three days for 1 week. Then discontinue. 7 capsule 0  . [DISCONTINUED] fluticasone (FLONASE) 50 MCG/ACT nasal spray Place 2 sprays into both nostrils daily. 16 g 6   No current facility-administered  medications on file prior to visit.     Review of Systems:  All pertinent positive/negative included in HPI, all other review of systems are negative   Objective:  Physical Exam BP 106/71   Pulse 73  CONSTITUTIONAL: Well-developed, well-nourished female in no acute distress.  EYES: EOM intact ENT: Normocephalic CARDIOVASCULAR: Regular rate  RESPIRATORY: Normal rate.  MUSCULOSKELETAL: Normal ROM SKIN: Warm, dry without erythema  NEUROLOGICAL: Alert, oriented, CN II-XII grossly intact, Appropriate balance   PSYCH: Normal behavior, mood   Assessment & Plan:  Assessment: 1. Migraine without aura and without status migrainosus, not intractable   Improved!   Plan: Continue daily metoprolol Maxalt and ibuprofen for acute meds Follow-up in 12 months or sooner PRN  Bertram Denver, PA-C 11/24/2020 9:02 AM

## 2020-12-25 ENCOUNTER — Encounter: Payer: Self-pay | Admitting: Family Medicine

## 2020-12-25 ENCOUNTER — Other Ambulatory Visit: Payer: Self-pay | Admitting: Medical

## 2020-12-25 DIAGNOSIS — G43119 Migraine with aura, intractable, without status migrainosus: Secondary | ICD-10-CM

## 2020-12-26 ENCOUNTER — Other Ambulatory Visit: Payer: Self-pay

## 2020-12-26 DIAGNOSIS — G43119 Migraine with aura, intractable, without status migrainosus: Secondary | ICD-10-CM

## 2020-12-26 MED ORDER — NORGESTIMATE-ETH ESTRADIOL 0.25-35 MG-MCG PO TABS: 1.0000 | ORAL_TABLET | Freq: Every day | ORAL | 7 refills | Status: DC

## 2020-12-26 MED FILL — METOPROLOL SUCCINATE ER 25: 25 | 90 days supply | Qty: 90 | Fill #0

## 2020-12-26 MED FILL — FEMYNOR 0.25-35 MG-MCG TABS: 0.25-35 | 84 days supply | Qty: 84 | Fill #0

## 2021-02-07 DIAGNOSIS — J029 Acute pharyngitis, unspecified: Secondary | ICD-10-CM | POA: Diagnosis not present

## 2021-02-07 DIAGNOSIS — Z20822 Contact with and (suspected) exposure to covid-19: Secondary | ICD-10-CM | POA: Diagnosis not present

## 2021-02-20 ENCOUNTER — Other Ambulatory Visit (HOSPITAL_COMMUNITY): Payer: Self-pay | Admitting: Pharmacist

## 2021-02-26 ENCOUNTER — Other Ambulatory Visit (HOSPITAL_BASED_OUTPATIENT_CLINIC_OR_DEPARTMENT_OTHER): Payer: Self-pay

## 2021-03-10 ENCOUNTER — Other Ambulatory Visit (HOSPITAL_COMMUNITY): Payer: Self-pay

## 2021-03-12 ENCOUNTER — Other Ambulatory Visit (HOSPITAL_COMMUNITY): Payer: Self-pay

## 2021-03-12 ENCOUNTER — Other Ambulatory Visit: Payer: Self-pay | Admitting: Medical

## 2021-03-12 DIAGNOSIS — G43119 Migraine with aura, intractable, without status migrainosus: Secondary | ICD-10-CM

## 2021-03-12 MED ORDER — NORGESTIMATE-ETH ESTRADIOL 0.25-35 MG-MCG PO TABS
1.0000 | ORAL_TABLET | Freq: Every day | ORAL | 2 refills | Status: DC
  Filled 2021-03-12 – 2021-07-06 (×4): qty 84, 84d supply, fill #0

## 2021-03-13 ENCOUNTER — Other Ambulatory Visit (HOSPITAL_COMMUNITY): Payer: Self-pay

## 2021-03-20 ENCOUNTER — Other Ambulatory Visit (HOSPITAL_COMMUNITY): Payer: Self-pay

## 2021-03-20 ENCOUNTER — Ambulatory Visit: Payer: 59 | Admitting: Dermatology

## 2021-03-20 ENCOUNTER — Encounter: Payer: Self-pay | Admitting: Dermatology

## 2021-03-20 ENCOUNTER — Other Ambulatory Visit: Payer: Self-pay

## 2021-03-20 DIAGNOSIS — R21 Rash and other nonspecific skin eruption: Secondary | ICD-10-CM | POA: Diagnosis not present

## 2021-03-20 MED ORDER — HYDROCORTISONE 2.5 % EX OINT
TOPICAL_OINTMENT | Freq: Every evening | CUTANEOUS | 1 refills | Status: DC
  Filled 2021-03-20: qty 28.35, 10d supply, fill #0
  Filled 2021-04-20: qty 28.35, 10d supply, fill #1

## 2021-03-20 MED FILL — Metoprolol Succinate Tab ER 24HR 25 MG (Tartrate Equiv): ORAL | 30 days supply | Qty: 30 | Fill #0 | Status: AC

## 2021-03-30 ENCOUNTER — Encounter: Payer: Self-pay | Admitting: Dermatology

## 2021-03-30 NOTE — Progress Notes (Signed)
   Follow-Up Visit   Subjective  Kristin Downs is a 40 y.o. female who presents for the following: Rash (Patient has rash on face and neck x week, it gets neon red and splotchy, hair gets really dry and starts to flake, very itchy. Per patient she has not used any topicals/creams for itch. She does take benadryl at night. No changes in facial lotion or other household products. No changes with animals.).  Rash on face and neck for several weeks Location:  Duration:  Quality:  Associated Signs/Symptoms: Modifying Factors:  Severity:  Timing: Context:   Objective  Well appearing patient in no apparent distress; mood and affect are within normal limits. Objective  Head - Anterior (Face), Neck - Anterior: Patchy subacute dermatitis scattered on cheeks neck and upper torso.  No changes mucosal nailbeds.  Contact allergic dermatitis.  Rash like neck, upper eye lids, facial mostly x weeks (rash/eczema/contact dermatitis) patient to be worked in x 1 month for patch testing and may cancel if improved    A focused examination was performed including Head, neck, upper torso, lymph nodes, mucosa, nailbeds.. Relevant physical exam findings are noted in the Assessment and Plan.   Assessment & Plan    Rash (2) Head - Anterior (Face); Neck - Anterior  1/2% hydrocortisone applied once or twice daily for the next 3 weeks.  If rash clears she may cancel her follow-up.      I, Janalyn Harder, MD, have reviewed all documentation for this visit.  The documentation on 03/30/21 for the exam, diagnosis, procedures, and orders are all accurate and complete.

## 2021-04-20 ENCOUNTER — Other Ambulatory Visit (HOSPITAL_COMMUNITY): Payer: Self-pay

## 2021-04-20 ENCOUNTER — Other Ambulatory Visit: Payer: Self-pay | Admitting: Family Medicine

## 2021-04-20 MED ORDER — METOPROLOL SUCCINATE ER 25 MG PO TB24
25.0000 mg | ORAL_TABLET | Freq: Every day | ORAL | 3 refills | Status: DC
  Filled 2021-04-20: qty 30, 30d supply, fill #0
  Filled 2021-05-25: qty 90, 90d supply, fill #1

## 2021-04-23 ENCOUNTER — Ambulatory Visit: Payer: 59 | Admitting: Dermatology

## 2021-05-15 ENCOUNTER — Other Ambulatory Visit (HOSPITAL_COMMUNITY): Payer: Self-pay

## 2021-05-15 MED ORDER — CARESTART COVID-19 HOME TEST VI KIT
PACK | 0 refills | Status: DC
Start: 2021-05-15 — End: 2021-08-30
  Filled 2021-05-15: qty 4, 4d supply, fill #0

## 2021-05-25 MED FILL — Bupropion HCl Tab ER 24HR 150 MG: ORAL | 90 days supply | Qty: 90 | Fill #0 | Status: AC

## 2021-05-28 ENCOUNTER — Other Ambulatory Visit (HOSPITAL_COMMUNITY): Payer: Self-pay

## 2021-06-12 ENCOUNTER — Other Ambulatory Visit (HOSPITAL_COMMUNITY): Payer: Self-pay

## 2021-06-14 ENCOUNTER — Encounter: Payer: Self-pay | Admitting: Orthopaedic Surgery

## 2021-06-14 ENCOUNTER — Ambulatory Visit (INDEPENDENT_AMBULATORY_CARE_PROVIDER_SITE_OTHER): Payer: 59

## 2021-06-14 ENCOUNTER — Ambulatory Visit: Payer: 59 | Admitting: Orthopaedic Surgery

## 2021-06-14 DIAGNOSIS — M25532 Pain in left wrist: Secondary | ICD-10-CM

## 2021-06-14 NOTE — Progress Notes (Signed)
Office Visit Note   Patient: Kristin Downs           Date of Birth: 1981/09/19           MRN: [id number] Visit Date: 06/14/2021              Requested by: Fayette Pho, MD [address] [place],  [place] [address] PCP: Fayette Pho, MD   Assessment & Plan: Visit Diagnoses:  1. Pain in left wrist     Plan: Impression is left wrist pain that is resolving and probable mild carpal tunnel syndrome bilateral.  She will continue to take NSAIDs and use ice as she is overall improving.  Based on discussion of treatment options for the suspected carpal tunnel syndrome she would like to try nighttime bracing.  She will follow-up as needed.  Follow-Up Instructions: No follow-ups on file.   Orders:  Orders Placed This Encounter  Procedures   XR Wrist Complete Left   No orders of the defined types were placed in this encounter.     Procedures: No procedures performed   Clinical Data: No additional findings.   Subjective: Chief Complaint  Patient presents with   Left Wrist - Pain    Kristin Downs is a 40 year old female wife of Kristin Downs who I have taken care of last couple years.  She comes in for evaluation of left wrist pain that is localized to the wrist joint.  She has noticed some soreness for about a month and a half during which time she has been using ice and ibuprofen 800 mg.  Denies any injuries.  She has noticed some swelling to the wrist and hand and fingers making her rings and her watch tighter.  Overall she has noticed improvement in the pain and the swelling.  She is right-hand dominant.  She feels that her hands go numb at night for couple years now.  She has never been treated for carpal tunnel syndrome.  She really has not worn any prescribed braces for this either.  Denies a history of gout.   Review of Systems  Constitutional: Negative.   HENT: Negative.    Eyes: Negative.   Respiratory: Negative.    Cardiovascular: Negative.   Endocrine: Negative.    Musculoskeletal: Negative.   Neurological: Negative.   Hematological: Negative.   Psychiatric/Behavioral: Negative.    All other systems reviewed and are negative.   Objective: Vital Signs: There were no vitals taken for this visit.  Physical Exam Vitals and nursing note reviewed.  Constitutional:      Appearance: She is well-developed.  HENT:     Head: Normocephalic and atraumatic.  Pulmonary:     Effort: Pulmonary effort is normal.  Abdominal:     Palpations: Abdomen is soft.  Musculoskeletal:     Cervical back: Neck supple.  Skin:    General: Skin is warm.     Capillary Refill: Capillary refill takes less than 2 seconds.  Neurological:     Mental Status: She is alert and oriented to person, place, and time.  Psychiatric:        Behavior: Behavior normal.        Thought Content: Thought content normal.        Judgment: Judgment normal.    Ortho Exam Left wrist shows decent range of motion.  She has some slight tenderness to the dorsal portion of the wrist joint just past Lister's tubercle.  I do not feel a joint effusion of the wrist.  There is no warmth or edema or cellulitis.  She does not have any overtly positive carpal tunnel compressive signs. Specialty Comments:  No specialty comments available.  Imaging: XR Wrist Complete Left  Result Date: 06/14/2021 No acute or structural abnormalities.  Ulnar negative variance    PMFS History: Patient Active Problem List   Diagnosis Date Noted   Right arm pain 09/11/2020   Obesity (BMI 30.0-34.9) 04/28/2019   Chronic non-seasonal allergic rhinitis 02/26/2019   Generalized anxiety disorder 02/26/2019   Healthcare maintenance 02/26/2019   Migraines 12/09/2018   Hypertension 12/09/2017   Allergies 12/09/1998   Past Medical History:  Diagnosis Date   Allergies 2000   Hypertension 2019   Migraines 2020    Family History  Problem Relation Age of Onset   Hypertension Mother    Hypertension Father    High  Cholesterol Father    Kidney cancer Father 28       69, incidental finding   Mood Disorder Sister    Migraines Sister    Migraines Brother    Alzheimer's disease Maternal Grandmother    Alzheimer's disease Maternal Grandfather    Heart disease Paternal Grandmother    Migraines Sister     Past Surgical History:  Procedure Laterality Date   NASAL SEPTUM SURGERY Right 2015   SHOULDER SURGERY Right 2004   2004-2009 - THREE surgeries  joint fracture   Social History   Occupational History   Not on file  Tobacco Use   Smoking status: Never   Smokeless tobacco: Never  Substance and Sexual Activity   Alcohol use: Yes    Comment: 2 per week   Drug use: Never   Sexual activity: Yes    Birth control/protection: OCP    Comment: Also used for migraines

## 2021-06-28 ENCOUNTER — Other Ambulatory Visit (HOSPITAL_COMMUNITY): Payer: Self-pay

## 2021-06-28 MED ORDER — PHENAZOPYRIDINE HCL 100 MG PO TABS
100.0000 mg | ORAL_TABLET | Freq: Three times a day (TID) | ORAL | 0 refills | Status: DC
  Filled 2021-06-28: qty 6, 2d supply, fill #0

## 2021-06-28 MED ORDER — NITROFURANTOIN MONOHYD MACRO 100 MG PO CAPS
100.0000 mg | ORAL_CAPSULE | Freq: Two times a day (BID) | ORAL | 0 refills | Status: DC
  Filled 2021-06-28: qty 14, 7d supply, fill #0

## 2021-06-29 ENCOUNTER — Other Ambulatory Visit (HOSPITAL_COMMUNITY): Payer: Self-pay

## 2021-07-06 ENCOUNTER — Other Ambulatory Visit (HOSPITAL_COMMUNITY): Payer: Self-pay

## 2021-08-26 ENCOUNTER — Other Ambulatory Visit: Payer: Self-pay

## 2021-08-27 ENCOUNTER — Other Ambulatory Visit (HOSPITAL_COMMUNITY): Payer: Self-pay

## 2021-08-28 ENCOUNTER — Other Ambulatory Visit (HOSPITAL_COMMUNITY): Payer: Self-pay

## 2021-08-29 ENCOUNTER — Other Ambulatory Visit: Payer: Self-pay

## 2021-08-29 ENCOUNTER — Other Ambulatory Visit (HOSPITAL_COMMUNITY): Payer: Self-pay

## 2021-08-29 DIAGNOSIS — F411 Generalized anxiety disorder: Secondary | ICD-10-CM

## 2021-08-29 NOTE — Telephone Encounter (Signed)
Pt calling to inquire about med refill for bupropion and metoprolol medication. Patient only has a couple of pills left. Please call pt at   [phone] when sent. Pt states she sent a request on Sunday. Sunday Spillers, CMA

## 2021-08-30 ENCOUNTER — Other Ambulatory Visit (HOSPITAL_COMMUNITY): Payer: Self-pay

## 2021-08-30 ENCOUNTER — Encounter: Payer: Self-pay | Admitting: Family Medicine

## 2021-08-30 ENCOUNTER — Other Ambulatory Visit: Payer: Self-pay | Admitting: Family Medicine

## 2021-08-30 DIAGNOSIS — F411 Generalized anxiety disorder: Secondary | ICD-10-CM

## 2021-08-30 DIAGNOSIS — I10 Essential (primary) hypertension: Secondary | ICD-10-CM

## 2021-08-30 DIAGNOSIS — Z3009 Encounter for other general counseling and advice on contraception: Secondary | ICD-10-CM

## 2021-08-30 MED ORDER — BUPROPION HCL ER (XL) 150 MG PO TB24
150.0000 mg | ORAL_TABLET | Freq: Every day | ORAL | 3 refills | Status: DC
Start: 2021-08-30 — End: 2022-08-25
  Filled 2021-08-30: qty 90, 90d supply, fill #0
  Filled 2021-11-30: qty 90, 90d supply, fill #1
  Filled 2022-03-04: qty 90, 90d supply, fill #2
  Filled 2022-05-15: qty 90, 90d supply, fill #3

## 2021-08-30 MED ORDER — METOPROLOL SUCCINATE ER 25 MG PO TB24
25.0000 mg | ORAL_TABLET | Freq: Every day | ORAL | 3 refills | Status: DC
  Filled 2021-08-30: qty 90, 90d supply, fill #0
  Filled 2021-11-30: qty 90, 90d supply, fill #1
  Filled 2021-11-30: qty 30, 30d supply, fill #1

## 2021-08-30 MED ORDER — NORGESTIMATE-ETH ESTRADIOL 0.25-35 MG-MCG PO TABS
1.0000 | ORAL_TABLET | Freq: Every day | ORAL | 2 refills | Status: DC
  Filled 2021-08-30 – 2021-09-30 (×2): qty 84, 84d supply, fill #0
  Filled 2021-12-14: qty 84, 84d supply, fill #1
  Filled 2022-03-04: qty 84, 84d supply, fill #2

## 2021-08-30 NOTE — Telephone Encounter (Signed)
Called to inform Ms. Kristin Downs of her refills submitted to pharmacy.   Fayette Pho, MD

## 2021-08-30 NOTE — Progress Notes (Signed)
Refills on contraception, metoprolol, and bupropion per patient request.   Fayette Pho, MD

## 2021-10-01 ENCOUNTER — Other Ambulatory Visit (HOSPITAL_COMMUNITY): Payer: Self-pay

## 2021-10-18 ENCOUNTER — Telehealth: Payer: Self-pay | Admitting: Radiology

## 2021-10-18 NOTE — Telephone Encounter (Signed)
Left message for patient to call [facility] to schedule headache f/u in January with Nada Maclachlan.

## 2021-11-30 ENCOUNTER — Other Ambulatory Visit (HOSPITAL_COMMUNITY): Payer: Self-pay

## 2021-11-30 ENCOUNTER — Other Ambulatory Visit: Payer: Self-pay | Admitting: Family Medicine

## 2021-11-30 DIAGNOSIS — I10 Essential (primary) hypertension: Secondary | ICD-10-CM

## 2021-11-30 MED ORDER — METOPROLOL SUCCINATE ER 25 MG PO TB24
25.0000 mg | ORAL_TABLET | Freq: Every day | ORAL | 0 refills | Status: DC
  Filled 2021-12-04: qty 90, 90d supply, fill #0

## 2021-12-03 ENCOUNTER — Other Ambulatory Visit (HOSPITAL_COMMUNITY): Payer: Self-pay

## 2021-12-04 ENCOUNTER — Other Ambulatory Visit (HOSPITAL_COMMUNITY): Payer: Self-pay

## 2021-12-05 ENCOUNTER — Emergency Department
Admission: EM | Admit: 2021-12-05 | Discharge: 2021-12-05 | Disposition: A | Discharge status: Home / Self Care | Payer: 59 | Source: Home / Self Care | Attending: Emergency Medicine | Admitting: Emergency Medicine

## 2021-12-05 ENCOUNTER — Other Ambulatory Visit: Payer: Self-pay

## 2021-12-05 ENCOUNTER — Ambulatory Visit: Admission: EM | Admit: 2021-12-05 | Discharge: 2021-12-05 | Discharge status: Against Medical Advice | Payer: 59

## 2021-12-05 ENCOUNTER — Encounter: Payer: Self-pay | Admitting: Family Medicine

## 2021-12-05 DIAGNOSIS — U071 COVID-19: Secondary | ICD-10-CM | POA: Diagnosis not present

## 2021-12-05 DIAGNOSIS — J069 Acute upper respiratory infection, unspecified: Secondary | ICD-10-CM

## 2021-12-05 LAB — POC SARS CORONAVIRUS 2 AG -  ED: SARS Coronavirus 2 Ag: POSITIVE — AB

## 2021-12-05 LAB — POCT INFLUENZA A/B
Influenza A, POC: NEGATIVE
Influenza B, POC: NEGATIVE

## 2021-12-05 MED ORDER — HYDROCOD POLST-CPM POLST ER 10-8 MG/5ML PO SUER
5.0000 mL | Freq: Two times a day (BID) | ORAL | 0 refills | Status: DC | PRN
Start: 2021-12-05 — End: 2021-12-06

## 2021-12-05 MED ORDER — IBUPROFEN 600 MG PO TABS: 600.0000 mg | ORAL_TABLET | Freq: Four times a day (QID) | ORAL | 0 refills | Status: DC | PRN

## 2021-12-05 MED ORDER — MOLNUPIRAVIR EUA 200MG CAPSULE: 4.0000 | ORAL_CAPSULE | Freq: Two times a day (BID) | ORAL | 0 refills | Status: AC

## 2021-12-05 NOTE — ED Provider Notes (Signed)
HPI  SUBJECTIVE:  Kristin Downs is a 40 y.o. female who presents with the acute onset of waxing/waning body aches, headaches, nasal congestion, rhinorrhea, postnasal drip, sore throat, cough, chest soreness secondary to cough.  She had some nausea and 2 episodes of vomiting 2 days ago, but has been tolerating p.o. since.  No fevers, loss of smell or taste, wheezing, shortness of breath, diarrhea, abdominal pain.  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  She got both COVID and flu vaccines.  No antipyretic in the past 6 hours.  She has been taking DayQuil with improvement in her symptoms.  No aggravating factors.  She has a past medical history of hypertension, migraines.  LMP: 2 weeks ago.  Denies possibility being pregnant.  PMD: [facility].   Past Medical History:  Diagnosis Date   Allergies 2000   Hypertension 2019   Migraines 2020    Past Surgical History:  Procedure Laterality Date   NASAL SEPTUM SURGERY Right 2015   SHOULDER SURGERY Right 2004   2004-2009 - THREE surgeries [place] joint fracture    Family History  Problem Relation Age of Onset   Hypertension Mother    Hypertension Father    High Cholesterol Father    Kidney cancer Father 59       61, incidental finding   Mood Disorder Sister    Migraines Sister    Migraines Brother    Alzheimer's disease Maternal Grandmother    Alzheimer's disease Maternal Grandfather    Heart disease Paternal Grandmother    Migraines Sister     Social History   Tobacco Use   Smoking status: Never   Smokeless tobacco: Never  Vaping Use   Vaping Use: Never used  Substance Use Topics   Alcohol use: Yes    Comment: 2 per week   Drug use: Never    No current facility-administered medications for this encounter.  Current Outpatient Medications:    ibuprofen (ADVIL) 600 MG tablet, Take 1 tablet (600 mg total) by mouth every 6 (six) hours as needed., Disp: 30 tablet, Rfl: 0    molnupiravir EUA (LAGEVRIO) 200 mg CAPS capsule, Take 4 capsules (800 mg total) by mouth 2 (two) times daily for 5 days., Disp: 40 capsule, Rfl: 0   ondansetron (ZOFRAN) 4 MG tablet, Take 4 mg by mouth every 6 (six) hours as needed for nausea or vomiting., Disp: , Rfl:    rizatriptan (MAXALT) 10 MG tablet, Take 10 mg by mouth as needed for migraine. May repeat in 2 hours if needed, Disp: , Rfl:    buPROPion (WELLBUTRIN XL) 150 MG 24 hr tablet, Take 1 tablet (150 mg total) by mouth daily., Disp: 90 tablet, Rfl: 3   chlorpheniramine-HYDROcodone (TUSSIONEX PENNKINETIC ER) 10-8 MG/5ML SUER, Take 5 mLs by mouth every 12 (twelve) hours as needed for cough., Disp: 60 mL, Rfl: 0   hydrOXYzine (ATARAX/VISTARIL) 10 MG tablet, TAKE 1 TABLET (10 MG TOTAL) BY MOUTH 3 (THREE) TIMES DAILY AS NEEDED FOR ANXIETY., Disp: 30 tablet, Rfl: 0   metoprolol succinate (TOPROL-XL) 25 MG 24 hr tablet, Take 1 tablet (25 mg total) by mouth daily. You need a PCP visit to check on your blood pressure. Call [phone]., Disp: 90 tablet, Rfl: 0   norgestimate-ethinyl estradiol (FEMYNOR) 0.25-35 MG-MCG tablet, Take 1 tablet by mouth daily., Disp: 84 tablet, Rfl: 2  Allergies  Allergen Reactions   Other Rash    Conch fish  Prednisone Anxiety     ROS  As noted in HPI.   Physical Exam  BP 108/71 (BP Location: Right Arm)    Pulse 97    Temp 99.2 F (37.3 C)    Resp 20    Ht 6' (1.829 m)    Wt 113.4 kg    LMP 11/14/2021 (Approximate)    SpO2 99%    BMI 33.91 kg/m   Constitutional: Well developed, well nourished, no acute distress.  Appears ill Eyes:  EOMI, conjunctiva normal bilaterally HENT: Normocephalic, atraumatic,mucus membranes moist.  Positive nasal congestion. Neck: Positive submental and cervical lymphadenopathy Respiratory: Normal inspiratory effort, lungs clear bilaterally Cardiovascular: Normal rate, regular rhythm, no murmurs, rubs, gallops GI: nondistended skin: No rash, skin intact Musculoskeletal: no  deformities Neurologic: Alert & oriented x 3, no focal neuro deficits Psychiatric: Speech and behavior appropriate   ED Course   Medications - No data to display  Orders Placed This Encounter  Procedures   POC SARS Coronavirus 2 Ag-ED - Nasal Swab    Standing Status:   Standing    Number of Occurrences:   1   POCT Influenza A/B    Standing Status:   Standing    Number of Occurrences:   1    No results found for this or any previous visit (from the past 24 hour(s)).  No results found.  ED Clinical Impression  1. COVID-19 virus infection      ED Assessment/Plan  [place] Narcotic database reviewed for this patient, and feel that the risk/benefit ratio today is favorable for proceeding with a prescription for controlled substance.  No opiate prescriptions in 2 years.  COVID positive.  Home with Molnupiravir, Flonase, saline nasal irrigation, patient states she already has this at home.  Tylenol/ibuprofen, Tussionex.  COVID work note.  Discussed labs,  MDM, treatment plan, and plan for follow-up with patient. Discussed sn/sx that should prompt return to the ED. patient agrees with plan.   Meds ordered this encounter  Medications   ibuprofen (ADVIL) 600 MG tablet    Sig: Take 1 tablet (600 mg total) by mouth every 6 (six) hours as needed.    Dispense:  30 tablet    Refill:  0   DISCONTD: chlorpheniramine-HYDROcodone (TUSSIONEX PENNKINETIC ER) 10-8 MG/5ML SUER    Sig: Take 5 mLs by mouth every 12 (twelve) hours as needed for cough.    Dispense:  60 mL    Refill:  0   molnupiravir EUA (LAGEVRIO) 200 mg CAPS capsule    Sig: Take 4 capsules (800 mg total) by mouth 2 (two) times daily for 5 days.    Dispense:  40 capsule    Refill:  0      *This clinic note was created using Scientist, clinical (histocompatibility and immunogenetics). Therefore, there may be occasional mistakes despite careful proofreading.  ?    Domenick Gong, MD 12/07/21 [phone]

## 2021-12-05 NOTE — ED Notes (Signed)
Mid-way through triage patient stated she was under the impression we did same day covid and flu testing. Once finding out we don't have that option here she wanted a refund and was directed to other sites that we verified have same day testing

## 2021-12-05 NOTE — ED Triage Notes (Signed)
Pt presents to Urgent Care with c/o cough, nasal congestion, sore throat, headache, N/V since yesterday. 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

## 2021-12-05 NOTE — Discharge Instructions (Addendum)
Your COVID is positive.  Flu is negative.  Molnupiravir, Flonase, saline nasal irrigation, Mucinex 600 mg of ibuprofen combined with 1000 mg of Tylenol,  Tussionex.  Plenty of rest, walk every day, push electrolyte containing fluids.

## 2021-12-05 NOTE — ED Triage Notes (Signed)
Sore throat starting last night, worsening into today. Also reporting cough, nausea, nasal congestion, headache. Denies vomiting, diarrhea, generalized body aches

## 2021-12-06 ENCOUNTER — Telehealth: Payer: Self-pay | Admitting: Physician Assistant

## 2021-12-06 MED ORDER — HYDROCOD POLST-CPM POLST ER 10-8 MG/5ML PO SUER: 5.0000 mL | Freq: Two times a day (BID) | ORAL | 0 refills | Status: DC | PRN

## 2021-12-06 NOTE — Telephone Encounter (Signed)
Tussionex cough medicine sent to [facility] pharmacy as requested by patient.

## 2021-12-06 NOTE — Telephone Encounter (Signed)
-----   Message from Maebelle Munroe, [place] sent at 12/06/2021  9:02 AM EST ----- Pt pharmacy has no Tussionex in stock, requesting script be sent to [facility] since pharmacy stated they couldn't transfer the script. Thank you.

## 2021-12-14 ENCOUNTER — Other Ambulatory Visit (HOSPITAL_COMMUNITY): Payer: Self-pay

## 2021-12-25 ENCOUNTER — Ambulatory Visit (INDEPENDENT_AMBULATORY_CARE_PROVIDER_SITE_OTHER): Payer: 59 | Admitting: Family Medicine

## 2021-12-25 ENCOUNTER — Encounter: Payer: Self-pay | Admitting: Family Medicine

## 2021-12-25 ENCOUNTER — Other Ambulatory Visit: Payer: Self-pay

## 2021-12-25 VITALS — BP 124/88 | HR 86 | Ht 72.0 in | Wt 262.8 lb

## 2021-12-25 DIAGNOSIS — Z131 Encounter for screening for diabetes mellitus: Secondary | ICD-10-CM | POA: Diagnosis not present

## 2021-12-25 DIAGNOSIS — F411 Generalized anxiety disorder: Secondary | ICD-10-CM

## 2021-12-25 DIAGNOSIS — R7401 Elevation of levels of liver transaminase levels: Secondary | ICD-10-CM

## 2021-12-25 DIAGNOSIS — E785 Hyperlipidemia, unspecified: Secondary | ICD-10-CM | POA: Diagnosis not present

## 2021-12-25 DIAGNOSIS — G43009 Migraine without aura, not intractable, without status migrainosus: Secondary | ICD-10-CM | POA: Diagnosis not present

## 2021-12-25 DIAGNOSIS — Z13220 Encounter for screening for lipoid disorders: Secondary | ICD-10-CM | POA: Diagnosis not present

## 2021-12-25 DIAGNOSIS — Z0000 Encounter for general adult medical examination without abnormal findings: Secondary | ICD-10-CM | POA: Diagnosis not present

## 2021-12-25 DIAGNOSIS — I10 Essential (primary) hypertension: Secondary | ICD-10-CM | POA: Diagnosis not present

## 2021-12-25 NOTE — Progress Notes (Signed)
SUBJECTIVE:   CHIEF COMPLAINT / HPI:   Ms. Kristin Downs is a pleasant 41 yo female here for annual physical and med refill.   HTN - currently on metoprolol 25 mg XR daily - previously on HCTZ, switched to metop for HTN+migraine control - no s/e, tolerating well  Migraines - has at least once a month - usually guaranteed one during menses, sometimes one more - maxalt 10 mg and zofran combo usually takes care of it - last terrible migraine that required [facility] visit and IM meds was about a year ago  Anxiety - going well with Wellbutrin - has atarax for PRN, uses quite sparingly - tolerating well, no adverse s/e  Health Maintenance - flu and COVID UTD, is [employer] employee - TDaP is UTD, will check [facility]  - breast cancer in both great-grandmothers - no significant family history of colon cancer - PAP smear UTD - no smoking history, no CT lung cancer screening indicated  PERTINENT  PMH / PSH:  Patient Active Problem List   Diagnosis Date Noted   Annual physical exam 12/25/2021   Obesity (BMI 30.0-34.9) 04/28/2019   Chronic non-seasonal allergic rhinitis 02/26/2019   Generalized anxiety disorder 02/26/2019   Healthcare maintenance 02/26/2019   Migraines 12/09/2018   Hypertension 12/09/2017   Allergies 12/09/1998     OBJECTIVE:   BP 124/88    Pulse 86    Ht 6' (1.829 m)    Wt 262 lb 12.8 oz (119.2 kg)    LMP 12/16/2021 (Approximate)    SpO2 97%    BMI 35.64 kg/m    PHQ-9:  Depression screen [facility] 2/9 12/25/2021 09/11/2020 09/06/2020  Decreased Interest 0 0 0  Down, Depressed, Hopeless 1 0 0  PHQ - 2 Score 1 0 0  Altered sleeping 1 0 1  Tired, decreased energy 1 0 1  Change in appetite 1 0 0  Feeling bad or failure about yourself  0 0 0  Trouble concentrating - 0 0  Moving slowly or fidgety/restless 0 0 0  Suicidal thoughts 0 0 0  PHQ-9 Score 4 0 2  Difficult doing work/chores - - Somewhat difficult    GAD-7:  GAD 7 : Generalized Anxiety Score 08/10/2020 07/19/2020   Nervous, Anxious, on Edge 0 1  Control/stop worrying 0 2  Worry too much - different things 0 1  Trouble relaxing 0 1  Restless 0 0  Easily annoyed or irritable 0 2  Afraid - awful might happen 0 0  Total GAD 7 Score 0 7    Physical Exam General: Awake, alert, oriented Cardiovascular: Regular rate and rhythm, S1 and S2 present, no murmurs auscultated Respiratory: Lung fields clear to auscultation bilaterally Neuro: Cranial nerves II through X grossly intact, able to move all extremities spontaneously  ASSESSMENT/PLAN:   Annual physical exam Flu, COVID, TDaP UTD. Pap smear next due in 2025. Hx breast ca in both great-grandmothers. No other significant family history. Mammograms to start at 40 yo. Colonoscopy to start at 41 yo. Never smoker, no lung scan indicated. Will collect A1c, CBC, CMP, lipid panel.   Generalized anxiety disorder Chronic, stable. Doing well with daily Wellbutrin and PRN atarax. No adverse side effects. No changes to regimen at this time.   Hypertension Stable, at goal with metoprolol. Tolerating well with no adverse side effects. No changes at this time.   Migraines Chronic, stable. Usually one migraine a month. Responds well to maxalt 10 mg and zofran combo. No changes to  regimen at this time.      Fayette Pho, MD [facility]

## 2021-12-25 NOTE — Patient Instructions (Addendum)
It was wonderful to meet you today. Thank you for allowing me to be a part of your care. Below is a short summary of what we discussed at your visit today:  [place] are all up to date on vaccines! We do not need to start mammograms until the age of 40 years old.  We do not need to start colonoscopy until the age of 40 years old. Your next Pap smear will be due in March 2025.  Liver function screening Today we obtained several labs, including those that we will check liver function. If the results are normal, I will send you a letter or MyChart message. If the results are abnormal, I will give you a call.    Cooking and Nutrition Classes [employer] in [place] provides many classes at low or no cost to [employer], nutrition, and agriculture.  Their website offers a huge variety of information related to topics such as gardening, nutrition, cooking, parenting, and health.  Also listed are classes and events, both online and in-person.  Check out their website here: https://[place].DefMagazine.is  Therapy Resources I tell everybody about this resource! Go to https://www.psychologytoday.com/us. Search for [place], [place] (or city of your choice).  On the next page, you will see filter options at the top.  You may filter therapists by insurance they take, issues you would like to work on, or therapy types.     If you have any questions or concerns, please do not hesitate to contact us via phone or MyChart message.   Ezequiel Essex, MD

## 2021-12-26 ENCOUNTER — Encounter: Payer: Self-pay | Admitting: Family Medicine

## 2021-12-26 DIAGNOSIS — E785 Hyperlipidemia, unspecified: Secondary | ICD-10-CM | POA: Insufficient documentation

## 2021-12-26 LAB — COMPREHENSIVE METABOLIC PANEL
ALT: 65 IU/L — ABNORMAL HIGH (ref 0–32)
AST: 37 IU/L (ref 0–40)
Albumin/Globulin Ratio: 1.8 (ref 1.2–2.2)
Albumin: 4.4 g/dL (ref 3.8–4.8)
Alkaline Phosphatase: 56 IU/L (ref 44–121)
BUN/Creatinine Ratio: 28 — ABNORMAL HIGH (ref 9–23)
BUN: 20 mg/dL (ref 6–20)
Bilirubin Total: 0.3 mg/dL (ref 0.0–1.2)
CO2: 19 mmol/L — ABNORMAL LOW (ref 20–29)
Calcium: 9.2 mg/dL (ref 8.7–10.2)
Chloride: 103 mmol/L (ref 96–106)
Creatinine, Ser: 0.72 mg/dL (ref 0.57–1.00)
Globulin, Total: 2.5 g/dL (ref 1.5–4.5)
Glucose: 100 mg/dL — ABNORMAL HIGH (ref 70–99)
Potassium: 3.9 mmol/L (ref 3.5–5.2)
Sodium: 138 mmol/L (ref 134–144)
Total Protein: 6.9 g/dL (ref 6.0–8.5)
eGFR: 109 mL/min/{1.73_m2} (ref >59)

## 2021-12-26 LAB — CBC
Hematocrit: 41.1 % (ref 34.0–46.6)
Hemoglobin: 13.8 g/dL (ref 11.1–15.9)
MCH: 28.3 pg (ref 26.6–33.0)
MCHC: 33.6 g/dL (ref 31.5–35.7)
MCV: 84 fL (ref 79–97)
Platelets: 296 10*3/uL (ref 150–450)
RBC: 4.87 x10E6/uL (ref 3.77–5.28)
RDW: 12.8 % (ref 11.7–15.4)
WBC: 6.2 10*3/uL (ref 3.4–10.8)

## 2021-12-26 LAB — LIPID PANEL
Chol/HDL Ratio: 3.9 ratio (ref 0.0–4.4)
Cholesterol, Total: 248 mg/dL — ABNORMAL HIGH (ref 100–199)
HDL: 64 mg/dL (ref >39)
LDL Chol Calc (NIH): 132 mg/dL — ABNORMAL HIGH (ref 0–99)
Triglycerides: 293 mg/dL — ABNORMAL HIGH (ref 0–149)
VLDL Cholesterol Cal: 52 mg/dL — ABNORMAL HIGH (ref 5–40)

## 2021-12-26 LAB — HEMOGLOBIN A1C
Est. average glucose Bld gHb Est-mCnc: 97 mg/dL
Hgb A1c MFr Bld: 5 % (ref 4.8–5.6)

## 2021-12-26 NOTE — Assessment & Plan Note (Signed)
Chronic, stable. Usually one migraine a month. Responds well to maxalt 10 mg and zofran combo. No changes to regimen at this time.

## 2021-12-26 NOTE — Assessment & Plan Note (Signed)
Stable, at goal with metoprolol. Tolerating well with no adverse side effects. No changes at this time.

## 2021-12-26 NOTE — Assessment & Plan Note (Signed)
Flu, COVID, TDaP UTD. Pap smear next due in 2025. Hx breast ca in both great-grandmothers. No other significant family history. Mammograms to start at 40 yo. Colonoscopy to start at 40 yo. Never smoker, no lung scan indicated. Will collect A1c, CBC, CMP, lipid panel.

## 2021-12-26 NOTE — Assessment & Plan Note (Signed)
Chronic, stable. Doing well with daily Wellbutrin and PRN atarax. No adverse side effects. No changes to regimen at this time.

## 2022-03-04 ENCOUNTER — Other Ambulatory Visit: Payer: Self-pay | Admitting: Family Medicine

## 2022-03-04 ENCOUNTER — Other Ambulatory Visit (HOSPITAL_COMMUNITY): Payer: Self-pay

## 2022-03-04 DIAGNOSIS — I10 Essential (primary) hypertension: Secondary | ICD-10-CM

## 2022-03-05 ENCOUNTER — Other Ambulatory Visit (HOSPITAL_COMMUNITY): Payer: Self-pay

## 2022-03-05 MED ORDER — METOPROLOL SUCCINATE ER 25 MG PO TB24
25.0000 mg | ORAL_TABLET | Freq: Every day | ORAL | 3 refills | Status: DC
  Filled 2022-03-05 – 2022-05-15 (×2): qty 90, 90d supply, fill #0
  Filled 2022-08-25: qty 90, 90d supply, fill #1
  Filled 2022-11-17 – 2022-11-20 (×2): qty 90, 90d supply, fill #2

## 2022-03-06 ENCOUNTER — Other Ambulatory Visit (HOSPITAL_COMMUNITY): Payer: Self-pay

## 2022-04-25 ENCOUNTER — Other Ambulatory Visit (HOSPITAL_COMMUNITY): Payer: Self-pay

## 2022-04-25 ENCOUNTER — Ambulatory Visit: Payer: 59 | Admitting: Podiatry

## 2022-04-25 ENCOUNTER — Ambulatory Visit (INDEPENDENT_AMBULATORY_CARE_PROVIDER_SITE_OTHER): Payer: 59

## 2022-04-25 DIAGNOSIS — M722 Plantar fascial fibromatosis: Secondary | ICD-10-CM

## 2022-04-25 MED ORDER — DICLOFENAC SODIUM 75 MG PO TBEC
75.0000 mg | DELAYED_RELEASE_TABLET | Freq: Two times a day (BID) | ORAL | 2 refills | Status: DC
Start: 2022-04-25 — End: 2023-03-13
  Filled 2022-04-25: qty 50, 25d supply, fill #0
  Filled 2022-05-15: qty 50, 25d supply, fill #1
  Filled 2022-08-25: qty 50, 25d supply, fill #2

## 2022-04-25 MED ORDER — TRIAMCINOLONE ACETONIDE 10 MG/ML IJ SUSP
20.0000 mg | Freq: Once | INTRAMUSCULAR | Status: AC
  Administered 2022-04-25: 20 mg

## 2022-04-26 NOTE — Progress Notes (Signed)
Subjective:   Patient ID: Kristin Downs, female   DOB: 41 y.o.   MRN: [id number]   HPI Patient states she did well for several years and has developed a reoccurrence of heel pain bilateral in the last few weeks   ROS      Objective:  Physical Exam  Neurovascular status intact exquisite discomfort medial fascial band heel region bilateral with fluid buildup around the medial band     Assessment:   acute plantar fasciitis bilateral     Plan:  H&P reviewed condition sterile prep and injected the plantar fascia at insertion calcaneus bilateral 3 mg Kenalog 5 mg Xylocaine and instructed on anti-inflammatory support shoe gear.  Reappoint if symptoms indicate  X-rays indicate large spur formation bilateral plantar no indication of stress fracture arthritis

## 2022-05-14 ENCOUNTER — Encounter: Payer: Self-pay | Admitting: *Deleted

## 2022-05-15 ENCOUNTER — Other Ambulatory Visit (HOSPITAL_COMMUNITY): Payer: Self-pay

## 2022-05-15 ENCOUNTER — Other Ambulatory Visit: Payer: Self-pay | Admitting: Family Medicine

## 2022-05-15 DIAGNOSIS — Z3009 Encounter for other general counseling and advice on contraception: Secondary | ICD-10-CM

## 2022-05-16 ENCOUNTER — Other Ambulatory Visit (HOSPITAL_COMMUNITY): Payer: Self-pay

## 2022-05-16 MED ORDER — NORGESTIMATE-ETH ESTRADIOL 0.25-35 MG-MCG PO TABS
1.0000 | ORAL_TABLET | Freq: Every day | ORAL | 2 refills | Status: DC
  Filled 2022-05-16 – 2022-06-07 (×2): qty 84, 84d supply, fill #0
  Filled 2022-08-25: qty 84, 84d supply, fill #1
  Filled 2022-11-17: qty 84, 84d supply, fill #2

## 2022-05-20 ENCOUNTER — Other Ambulatory Visit (HOSPITAL_COMMUNITY): Payer: Self-pay

## 2022-05-21 ENCOUNTER — Other Ambulatory Visit (HOSPITAL_COMMUNITY): Payer: Self-pay

## 2022-06-07 ENCOUNTER — Other Ambulatory Visit (HOSPITAL_COMMUNITY): Payer: Self-pay

## 2022-07-20 ENCOUNTER — Telehealth: Payer: 59 | Admitting: Nurse Practitioner

## 2022-07-20 DIAGNOSIS — R399 Unspecified symptoms and signs involving the genitourinary system: Secondary | ICD-10-CM | POA: Diagnosis not present

## 2022-07-20 MED ORDER — NITROFURANTOIN MONOHYD MACRO 100 MG PO CAPS: 100.0000 mg | ORAL_CAPSULE | Freq: Two times a day (BID) | ORAL | 0 refills | Status: AC

## 2022-07-20 NOTE — Progress Notes (Signed)
I have spent 5 minutes in review of e-visit questionnaire, review and updating patient chart, medical decision making and response to patient.  ° °Adessa Primiano W Gilbert Manolis, NP ° °  °

## 2022-07-20 NOTE — Progress Notes (Signed)

## 2022-08-25 ENCOUNTER — Other Ambulatory Visit: Payer: Self-pay | Admitting: Family Medicine

## 2022-08-25 ENCOUNTER — Other Ambulatory Visit (HOSPITAL_COMMUNITY): Payer: Self-pay

## 2022-08-25 DIAGNOSIS — F411 Generalized anxiety disorder: Secondary | ICD-10-CM

## 2022-08-26 ENCOUNTER — Other Ambulatory Visit (HOSPITAL_COMMUNITY): Payer: Self-pay

## 2022-08-27 ENCOUNTER — Other Ambulatory Visit (HOSPITAL_COMMUNITY): Payer: Self-pay

## 2022-08-27 MED ORDER — BUPROPION HCL ER (XL) 150 MG PO TB24
150.0000 mg | ORAL_TABLET | Freq: Every day | ORAL | 3 refills | Status: DC
  Filled 2022-08-27: qty 90, 90d supply, fill #0
  Filled 2022-11-17: qty 90, 90d supply, fill #1
  Filled 2023-02-19 – 2023-02-28 (×2): qty 90, 90d supply, fill #2

## 2022-08-28 ENCOUNTER — Other Ambulatory Visit (HOSPITAL_COMMUNITY): Payer: Self-pay

## 2022-11-17 ENCOUNTER — Other Ambulatory Visit (HOSPITAL_COMMUNITY): Payer: Self-pay

## 2022-11-18 ENCOUNTER — Other Ambulatory Visit (HOSPITAL_COMMUNITY): Payer: Self-pay

## 2022-11-19 ENCOUNTER — Other Ambulatory Visit (HOSPITAL_COMMUNITY): Payer: Self-pay

## 2022-11-21 ENCOUNTER — Other Ambulatory Visit (HOSPITAL_COMMUNITY): Payer: Self-pay

## 2023-01-16 ENCOUNTER — Telehealth: Payer: Commercial Managed Care - PPO | Admitting: Nurse Practitioner

## 2023-01-16 ENCOUNTER — Other Ambulatory Visit (HOSPITAL_COMMUNITY): Payer: Self-pay

## 2023-01-16 DIAGNOSIS — R21 Rash and other nonspecific skin eruption: Secondary | ICD-10-CM

## 2023-01-16 MED ORDER — BETAMETHASONE DIPROPIONATE 0.05 % EX CREA
TOPICAL_CREAM | Freq: Two times a day (BID) | CUTANEOUS | 0 refills | Status: DC
  Filled 2023-01-16: qty 30, 10d supply, fill #0

## 2023-01-16 MED ORDER — VITAMINS A & D EX OINT
TOPICAL_OINTMENT | Freq: Two times a day (BID) | CUTANEOUS | 0 refills | Status: DC
  Filled 2023-01-16: qty 480, 14d supply, fill #0

## 2023-01-16 NOTE — Progress Notes (Signed)
E Visit for Rash  We are sorry that you are not feeling well. Here is how we plan to help!  Based on what you shared with me it looks like you have contact dermatitis.  Contact dermatitis is a skin rash caused by something that touches the skin and causes irritation or inflammation.  Your skin may be red, swollen, dry, cracked, and itch.  The rash should go away in a few days but can last a few weeks.  If you get a rash, it's important to figure out what caused it so the irritant can be avoided in the future.  We have prescribed the following cream combination. If your pharmacy does not have that and you need an alternative just let me know. I liek this combination for atypical rashes with unknown irritant exposure  Meds ordered this encounter  Medications   betamethasone 0.5% cream-vitamin a&d ointment 1:1 mixture    Sig: Apply topically 2 (two) times daily for 14 days.    Dispense:  480 g    Refill:  0     HOME CARE:  Take cool showers and avoid direct sunlight. Apply cool compress or wet dressings. Take a bath in an oatmeal bath.  Sprinkle content of one Aveeno packet under running faucet with comfortably warm water.  Bathe for 15-20 minutes, 1-2 times daily.  Pat dry with a towel. Do not rub the rash. Use hydrocortisone cream. Take an antihistamine like Benadryl for widespread rashes that itch.  The adult dose of Benadryl is 25-50 mg by mouth 4 times daily. Caution:  This type of medication may cause sleepiness.  Do not drink alcohol, drive, or operate dangerous machinery while taking antihistamines.  Do not take these medications if you have prostate enlargement.  Read package instructions thoroughly on all medications that you take.  GET HELP RIGHT AWAY IF:  Symptoms don't go away after treatment. Severe itching that persists. If you rash spreads or swells. If you rash begins to smell. If it blisters and opens or develops a yellow-brown crust. You develop a fever. You have a  sore throat. You become short of breath.  MAKE SURE YOU:  Understand these instructions. Will watch your condition. Will get help right away if you are not doing well or get worse.  Thank you for choosing an e-visit.  Your e-visit answers were reviewed by a board certified advanced clinical practitioner to complete your personal care plan. Depending upon the condition, your plan could have included both over the counter or prescription medications.  Please review your pharmacy choice. Make sure the pharmacy is open so you can pick up prescription now. If there is a problem, you may contact your provider through [employer] and have the prescription routed to another pharmacy.  Your safety is important to [place]. If you have drug allergies check your prescription carefully.   For the next 24 hours you can use MyChart to ask questions about today's visit, request a non-urgent call back, or ask for a work or school excuse. You will get an email in the next two days asking about your experience. I hope that your e-visit has been valuable and will speed your recovery.   I spent approximately 7 minutes reviewing the patient's history, current symptoms and coordinating their plan of care today.

## 2023-01-16 NOTE — Addendum Note (Signed)
Addended by: Madilyn Hook on: 01/16/2023 01:34 PM   Modules accepted: Orders

## 2023-02-03 ENCOUNTER — Other Ambulatory Visit (HOSPITAL_COMMUNITY): Payer: Self-pay

## 2023-02-10 ENCOUNTER — Other Ambulatory Visit: Payer: Self-pay | Admitting: Family Medicine

## 2023-02-10 ENCOUNTER — Telehealth: Payer: Commercial Managed Care - PPO | Admitting: Family Medicine

## 2023-02-10 DIAGNOSIS — R21 Rash and other nonspecific skin eruption: Secondary | ICD-10-CM

## 2023-02-10 DIAGNOSIS — Z3009 Encounter for other general counseling and advice on contraception: Secondary | ICD-10-CM

## 2023-02-10 NOTE — Progress Notes (Signed)
Because you were treated for this one month back and still having a rash- it is our policy to have you seen in person- you can reach out to your PCP office or follow up at an urgent care in person. Your condition warrants further evaluation and it is recommend that you be seen for a face to face visit.  Please contact your primary care physician practice to be seen. Many offices offer virtual options to be seen via video if you are not comfortable going in person to a medical facility at this time.  NOTE: You will NOT be charged for this eVisit.

## 2023-02-11 ENCOUNTER — Ambulatory Visit: Payer: Commercial Managed Care - PPO | Admitting: Student

## 2023-02-11 ENCOUNTER — Other Ambulatory Visit: Payer: Self-pay

## 2023-02-11 ENCOUNTER — Encounter: Payer: Self-pay | Admitting: Student

## 2023-02-11 VITALS — BP 146/92 | HR 89 | Ht 72.0 in | Wt 261.0 lb

## 2023-02-11 DIAGNOSIS — B079 Viral wart, unspecified: Secondary | ICD-10-CM

## 2023-02-11 DIAGNOSIS — R21 Rash and other nonspecific skin eruption: Secondary | ICD-10-CM | POA: Diagnosis not present

## 2023-02-11 MED ORDER — NORGESTIMATE-ETH ESTRADIOL 0.25-35 MG-MCG PO TABS
1.0000 | ORAL_TABLET | Freq: Every day | ORAL | 3 refills | Status: AC
  Filled 2023-02-11: qty 84, 84d supply, fill #0
  Filled 2023-04-06: qty 84, 84d supply, fill #1

## 2023-02-11 NOTE — Patient Instructions (Signed)
It was great to see you! Thank you for allowing me to participate in your care!  I recommend that you always bring your medications to each appointment as this makes it easy to ensure you are on the correct medications and helps [place] not miss when refills are needed.  Our plans for today:  - We will let you know about your biopsy results - You can use over the counter Salicylic acid on the warts if desired.  - Return for you next appointment  Take care and seek immediate care sooner if you develop any concerns.   Dr. Precious Gilding, DO [facility]

## 2023-02-11 NOTE — Progress Notes (Unsigned)
    SUBJECTIVE:   CHIEF COMPLAINT / HPI:   Rash on R hand Red bumpy rash presented about 3 weeks ago, it itches but not painful. No fevers. Has not been outside in woods or gardening, no new products. Had and e-visit and since has been applying A&D ointment and betamethasone 0.05% twice daily. This past week has started spread up to wrist.   PERTINENT  PMH / PSH: ***  OBJECTIVE:   BP (!) 146/92   Pulse 89   Ht 6' (1.829 m)   Wt 261 lb (118.4 kg)   SpO2 100%   BMI 35.40 kg/m  ***  General: NAD, pleasant, able to participate in exam Cardiac: RRR, no murmurs. Respiratory: CTAB, normal effort, No wheezes, rales or rhonchi Abdomen: Bowel sounds present, nontender, nondistended, no hepatosplenomegaly. Extremities: no edema or cyanosis. Skin: warm and dry, no rashes noted Neuro: alert, no obvious focal deficits Psych: Normal affect and mood  ASSESSMENT/PLAN:   No problem-specific Assessment & Plan notes found for this encounter.     Dr. Precious Gilding, [place]    {    This will disappear when note is signed, click to select method of visit    :1}

## 2023-02-13 DIAGNOSIS — R21 Rash and other nonspecific skin eruption: Secondary | ICD-10-CM | POA: Insufficient documentation

## 2023-02-13 DIAGNOSIS — B079 Viral wart, unspecified: Secondary | ICD-10-CM | POA: Insufficient documentation

## 2023-02-13 HISTORY — DX: Rash and other nonspecific skin eruption: R21

## 2023-02-13 HISTORY — DX: Viral wart, unspecified: B07.9

## 2023-02-13 NOTE — Assessment & Plan Note (Signed)
Unsure what this rash is.  It has not improved with topical steroids being applied over the last few weeks.  It looks vesicular, almost like poison ivy however I would have expected it to have improved by now if that was the case.  As it is persistent, is spreading, will obtain skin biopsy to help with diagnosis.  Patient advised to continue applying A&D ointment while waiting for diagnosis.

## 2023-02-13 NOTE — Assessment & Plan Note (Signed)
Flat pink lesions on right extremity do appear to be viral warts.  Patient advised she can trial OTC salicylic acid if she would like.  She has been scheduled in skin clinic for further discussion.  Could consider treatment with fluorouracil or patient may need to go back to dermatology.

## 2023-02-16 ENCOUNTER — Other Ambulatory Visit: Payer: Self-pay | Admitting: Family Medicine

## 2023-02-16 DIAGNOSIS — I10 Essential (primary) hypertension: Secondary | ICD-10-CM

## 2023-02-17 ENCOUNTER — Other Ambulatory Visit (HOSPITAL_COMMUNITY): Payer: Self-pay

## 2023-02-17 ENCOUNTER — Other Ambulatory Visit: Payer: Self-pay

## 2023-02-17 ENCOUNTER — Telehealth: Payer: Self-pay | Admitting: Student

## 2023-02-17 MED ORDER — METOPROLOL SUCCINATE ER 25 MG PO TB24
25.0000 mg | ORAL_TABLET | Freq: Every day | ORAL | 3 refills | Status: AC
  Filled 2023-02-17: qty 90, 90d supply, fill #0

## 2023-02-17 NOTE — Telephone Encounter (Signed)
Called pt to discuss path results showing likely contact dermatitis/ allergic rxn to something in the environment. As steroid  cream did not help over the past few weeks she was advised to only apply Vaseline or Aquaphor daily and return for follow up apt in April

## 2023-02-18 ENCOUNTER — Encounter: Payer: Self-pay | Admitting: Student

## 2023-02-19 ENCOUNTER — Other Ambulatory Visit: Payer: Self-pay

## 2023-02-20 ENCOUNTER — Other Ambulatory Visit: Payer: Self-pay

## 2023-02-24 ENCOUNTER — Other Ambulatory Visit: Payer: Self-pay

## 2023-02-28 ENCOUNTER — Other Ambulatory Visit (HOSPITAL_COMMUNITY): Payer: Self-pay

## 2023-03-11 ENCOUNTER — Encounter: Payer: Commercial Managed Care - PPO | Admitting: Family Medicine

## 2023-03-11 NOTE — Progress Notes (Unsigned)
  SUBJECTIVE:   CHIEF COMPLAINT / HPI:   Rash:  Previously seen in clinic 02/11/2023 for these symptoms. Trialed topical steroid without benefit of symptoms. Had biopsy and pathology report revealed likely dermatitis/related to allergic reaction. Patient reports that the symptoms dissipated after a couple of weeks and have since returned a couple of days ago. She is unsure of the cause of the irritation on her wrist.  Flat Warts: Trialed OTC salicylic acid for this. Tried imiquimod, zinc cream for quite some time with dermatology with no relief of symptoms. Dermatology instructed the patient to wait for the symptoms to go away and she has not since returned.   Immunocompromised: No  Symptoms Itching: somewhat Pain over rash: no Feeling ill all over: no Fever: no Mouth sores: no Face or tongue swelling: no Trouble breathing: no Joint swelling or pain: no  Denies systemic symptoms.   Photos from visit 02/11/23       PERTINENT  PMH / PSH:  HTN Migraines  Allergic rhinitis  Viral warts  GAD Obesity HLD  Patient Care Team: Ezequiel Essex, MD as PCP - General (Family Medicine) Vanita Ingles, NP as Nurse Practitioner (Nurse Practitioner) Lavonna Monarch, MD (Inactive) as Consulting Physician (Dermatology) OBJECTIVE:  BP 118/82   Pulse 92   Temp 98.9 F (37.2 C)   Ht 6' (1.829 m)   Wt 254 lb 9.6 oz (115.5 kg)   LMP  (LMP Unknown)   SpO2 94%   BMI 34.53 kg/m  Physical Exam  General: NAD, pleasant, able to participate in exam Respiratory: No respiratory distress Skin: Presence of irritation/excoriation/erythema/non-raised area over radial aspect of wrist Scant small flat and raised pink macules on hand finger and arm   Psych: Normal affect and mood  Procedure: Diagnosis: Viral Warts  Procedure: Cryotherapy Location: Radial wrist, right second finger   After discussion of the risks, benefits, and alternative therapies available, the patient elected to  proceed with verbal consent. The patient's identity, procedure, and site were verified during a time out prior to proceeding procedure. The lesions on the radial wrist, right second finger were treated using liquid nitrogen spray gun for 6 second per cycle, 3 cycles total. The patient tolerated the procedure well and there were no immediate complications.  Patient was provided aftercare handout and advised to return if lesion(s) did not fully resolved.   ASSESSMENT/PLAN:  Dermatitis Assessment & Plan: Has betamethasone at home. Would have her cover the area at work to see if her exacerbation is due to an irritant at work. Will continue with steroid (short term) with unscented moisturizer and return precautions given.    Other viral warts Assessment & Plan: Multiple treatments tried. Obtained verbal consent with risk and benefits discussed/elected for cryotherapy of 2 warts to see if there is resolution of symptoms with shared decision making. Performed without difficulty. Will return to discuss if she would like cryotherapy on the other locations.        Return if symptoms worsen or fail to improve. [name], MD 03/13/2023, 8:45 AM PGY-2, [place]

## 2023-03-12 ENCOUNTER — Ambulatory Visit: Payer: Commercial Managed Care - PPO | Admitting: Student

## 2023-03-12 VITALS — BP 118/82 | HR 92 | Temp 98.9°F | Ht 72.0 in | Wt 254.6 lb

## 2023-03-12 DIAGNOSIS — L309 Dermatitis, unspecified: Secondary | ICD-10-CM

## 2023-03-12 DIAGNOSIS — B078 Other viral warts: Secondary | ICD-10-CM | POA: Diagnosis not present

## 2023-03-12 NOTE — Patient Instructions (Addendum)
It was great to see you today! Thank you for choosing [facility] for your primary care. Kristin Downs was seen for skin concern.  Today we addressed: We will trial freezing the area  Use the steroid on your hand for the dermatitis  Use duct tape after this is performed  You may call to schedule with derm clinic if you would like more  If you haven't already, sign up for My Chart to have easy access to your labs results, and communication with your primary care physician.  I recommend that you always bring your medications to each appointment as this makes it easy to ensure you are on the correct medications and helps [place] not miss refills when you need them. Call the clinic at [phone] if your symptoms worsen or you have any concerns.  You should return to our clinic Return if symptoms worsen or fail to improve. Please arrive 15 minutes before your appointment to ensure smooth check in process.  We appreciate your efforts in making this happen.  Thank you for allowing me to participate in your care, Kristin Emery, MD 03/12/2023, 8:58 AM PGY-2, [place]

## 2023-03-13 DIAGNOSIS — L309 Dermatitis, unspecified: Secondary | ICD-10-CM | POA: Insufficient documentation

## 2023-03-13 HISTORY — DX: Dermatitis, unspecified: L30.9

## 2023-03-13 NOTE — Assessment & Plan Note (Addendum)
Has betamethasone at home. Would have her cover the area at work to see if her exacerbation is due to an irritant at work. Will continue with steroid (short term) with unscented moisturizer and return precautions given.

## 2023-03-13 NOTE — Assessment & Plan Note (Signed)
Multiple treatments tried. Obtained verbal consent with risk and benefits discussed/elected for cryotherapy of 2 warts to see if there is resolution of symptoms with shared decision making. Performed without difficulty. Will return to discuss if she would like cryotherapy on the other locations.

## 2023-03-17 ENCOUNTER — Ambulatory Visit (INDEPENDENT_AMBULATORY_CARE_PROVIDER_SITE_OTHER): Payer: Commercial Managed Care - PPO | Admitting: Family Medicine

## 2023-03-17 ENCOUNTER — Other Ambulatory Visit (HOSPITAL_COMMUNITY): Payer: Self-pay

## 2023-03-17 ENCOUNTER — Encounter: Payer: Self-pay | Admitting: Family Medicine

## 2023-03-17 VITALS — BP 118/74 | HR 78 | Ht 72.0 in | Wt 252.0 lb

## 2023-03-17 DIAGNOSIS — F411 Generalized anxiety disorder: Secondary | ICD-10-CM

## 2023-03-17 DIAGNOSIS — E785 Hyperlipidemia, unspecified: Secondary | ICD-10-CM

## 2023-03-17 DIAGNOSIS — E669 Obesity, unspecified: Secondary | ICD-10-CM

## 2023-03-17 DIAGNOSIS — I10 Essential (primary) hypertension: Secondary | ICD-10-CM

## 2023-03-17 DIAGNOSIS — Z0000 Encounter for general adult medical examination without abnormal findings: Secondary | ICD-10-CM | POA: Diagnosis not present

## 2023-03-17 DIAGNOSIS — E66811 Obesity, class 1: Secondary | ICD-10-CM

## 2023-03-17 DIAGNOSIS — R748 Abnormal levels of other serum enzymes: Secondary | ICD-10-CM

## 2023-03-17 DIAGNOSIS — Z3040 Encounter for surveillance of contraceptives, unspecified: Secondary | ICD-10-CM

## 2023-03-17 DIAGNOSIS — G43009 Migraine without aura, not intractable, without status migrainosus: Secondary | ICD-10-CM | POA: Diagnosis not present

## 2023-03-17 MED ORDER — ONDANSETRON HCL 4 MG PO TABS
4.0000 mg | ORAL_TABLET | Freq: Four times a day (QID) | ORAL | 1 refills | Status: AC | PRN
Start: 2023-03-17 — End: ?
  Filled 2023-03-17: qty 20, 5d supply, fill #0

## 2023-03-17 MED ORDER — BUPROPION HCL ER (XL) 150 MG PO TB24
150.0000 mg | ORAL_TABLET | Freq: Every day | ORAL | 3 refills | Status: AC
Start: 2023-03-17 — End: ?
  Filled 2023-03-17: qty 90, 90d supply, fill #0

## 2023-03-17 MED ORDER — HYDROXYZINE HCL 10 MG PO TABS
10.0000 mg | ORAL_TABLET | Freq: Three times a day (TID) | ORAL | 1 refills | Status: AC | PRN
Start: 2023-03-17 — End: ?
  Filled 2023-03-17: qty 30, 10d supply, fill #0
  Filled 2023-04-06: qty 30, 10d supply, fill #1

## 2023-03-17 MED ORDER — RIZATRIPTAN BENZOATE 10 MG PO TABS
10.0000 mg | ORAL_TABLET | ORAL | 1 refills | Status: AC | PRN
Start: 2023-03-17 — End: ?
  Filled 2023-03-17: qty 10, 30d supply, fill #0

## 2023-03-17 NOTE — Assessment & Plan Note (Signed)
Patient considering tubal ligation for permanent contraception. Discussed LARCs, including progesterone only versions. Patient has considered these and would rather have a permanent solution. Refer to [facility] for discussion regarding tubal ligation. Recommended she consider bilateral salpingectomy for sterilization and reduction of ovarian cancer risk.

## 2023-03-17 NOTE — Assessment & Plan Note (Signed)
On Wellbutrin and hydroxyzine. Tolerating well, no adverse side effects. Refilled.

## 2023-03-17 NOTE — Assessment & Plan Note (Signed)
Chronic and stable. On rizatriptan, metoprolol, and zofran. No adverse s/e. Will refill.

## 2023-03-17 NOTE — Assessment & Plan Note (Addendum)
PAP UTD, next due 2025.  Mammograms to start around 33 - 41 yo.  Colonoscopies to start 42 yo.  Never smoker, no low dose CT indicated.  Labs today: CBC, CMP, A1c, lipids

## 2023-03-17 NOTE — Patient Instructions (Addendum)
It was wonderful to see you today. Thank you for allowing me to be a part of your care. Below is a short summary of what we discussed at your visit today:  Annual physical  Today your vitals looked great! We also collected blood work. If the results are normal, I will send you a letter or MyChart message. If the results are abnormal, I will give you a call.    Contraception Today we talked about your desire to consider having your "tubes tied". I have referred you to [facility] for further discussion, as they would be the ones to perform the surgery.   Someone from their office should be calling you in 1 to 2 weeks to schedule an appointment.  If you do not hear from them, let us know. We may need to nudge along the referral.    Weight loss Congrats on your efforts! Keep up the good work. Remember, this is a marathon filled with behavior changes, not a sprint.   Walking is one of the best exercises for weight loss.   If you would like eating plans and regular check ins, you could consider the [facility]. You do not need a referral and can call yourself for an appointment. Info below.  [facility] at [facility] Address: [address] [place], [place] [address] Phone: [phone]  Please bring all of your medications to every appointment!  If you have any questions or concerns, please do not hesitate to contact us via phone or MyChart message.   Fayette Pho, MD

## 2023-03-17 NOTE — Progress Notes (Addendum)
   SUBJECTIVE:   CHIEF COMPLAINT / HPI:   Annual physical  Kristin Downs is a pleasant 42 y.o. woman who presents for her annual physical exam.  Medications and allergies updated.  Vaccines due: None Screenings due: None Pap: UTD, last 02/25/19 NILM HPV (-) Mammogram: n/a - age Colonoscopy: n/a - age DEXA: n/a - age   Contraception Currently on OCPs. Has been for 20 or so years. Would like to consider tubal ligation as she would like to come off OCPs.   PERTINENT  PMH / PSH:  Patient Active Problem List   Diagnosis Date Noted   Encounter for surveillance of contraceptives 03/17/2023   Hyperlipidemia 12/26/2021   Annual physical exam 12/25/2021   Obesity (BMI 30.0-34.9) 04/28/2019   Chronic non-seasonal allergic rhinitis 02/26/2019   Generalized anxiety disorder 02/26/2019   Healthcare maintenance 02/26/2019   Migraines 12/09/2018   Hypertension 12/09/2017   Allergies 12/09/1998    OBJECTIVE:   BP 118/74   Pulse 78   Ht 6' (1.829 m)   Wt 252 lb (114.3 kg)   LMP 03/06/2023   SpO2 98%   BMI 34.18 kg/m    PHQ-9:     03/17/2023    4:06 PM 12/25/2021    3:32 PM 09/11/2020   11:54 AM  Depression screen PHQ 2/9  Decreased Interest 1 0 0  Down, Depressed, Hopeless 1 1 0  PHQ - 2 Score 2 1 0  Altered sleeping 1 1 0  Tired, decreased energy 1 1 0  Change in appetite 2 1 0  Feeling bad or failure about yourself  0 0 0  Trouble concentrating 2  0  Moving slowly or fidgety/restless 0 0 0  Suicidal thoughts 0 0 0  PHQ-9 Score 8 4 0    Physical Exam General: Awake, alert, oriented HEENT: nasal mucosa moist, posterior oropharynx clear without erythema or exudate Neck: thyroid unremarkable to palpation Cardiovascular: Regular rate and rhythm, S1 and S2 present, no murmurs auscultated Respiratory: Lung fields clear to auscultation bilaterally  ASSESSMENT/PLAN:   Annual physical exam PAP UTD, next due 2025.  Mammograms to start around 38 - 87 yo.  Colonoscopies to  start 42 yo.  Never smoker, no low dose CT indicated.  Labs today: CBC, CMP, A1c, lipids  Generalized anxiety disorder On Wellbutrin and hydroxyzine. Tolerating well, no adverse side effects. Refilled.   Encounter for surveillance of contraceptives Patient considering tubal ligation for permanent contraception. Discussed LARCs, including progesterone only versions. Patient has considered these and would rather have a permanent solution. Refer to [facility] for discussion regarding tubal ligation. Recommended she consider bilateral salpingectomy for sterilization and reduction of ovarian cancer risk.   Migraines Chronic and stable. On rizatriptan, metoprolol, and zofran. No adverse s/e. Will refill.      Fayette Pho, MD [facility]

## 2023-03-18 LAB — LIPID PANEL
Chol/HDL Ratio: 3.7 ratio (ref 0.0–4.4)
Cholesterol, Total: 258 mg/dL — ABNORMAL HIGH (ref 100–199)
HDL: 70 mg/dL (ref >39)
LDL Chol Calc (NIH): 155 mg/dL — ABNORMAL HIGH (ref 0–99)
Triglycerides: 185 mg/dL — ABNORMAL HIGH (ref 0–149)
VLDL Cholesterol Cal: 33 mg/dL (ref 5–40)

## 2023-03-18 LAB — CBC
Hematocrit: 41.3 % (ref 34.0–46.6)
Hemoglobin: 13.3 g/dL (ref 11.1–15.9)
MCH: 27.4 pg (ref 26.6–33.0)
MCHC: 32.2 g/dL (ref 31.5–35.7)
MCV: 85 fL (ref 79–97)
Platelets: 272 10*3/uL (ref 150–450)
RBC: 4.85 x10E6/uL (ref 3.77–5.28)
RDW: 13.1 % (ref 11.7–15.4)
WBC: 6.6 10*3/uL (ref 3.4–10.8)

## 2023-03-18 LAB — COMPREHENSIVE METABOLIC PANEL
ALT: 37 IU/L — ABNORMAL HIGH (ref 0–32)
AST: 22 IU/L (ref 0–40)
Albumin/Globulin Ratio: 1.4 (ref 1.2–2.2)
Albumin: 4.2 g/dL (ref 3.9–4.9)
Alkaline Phosphatase: 57 IU/L (ref 44–121)
BUN/Creatinine Ratio: 30 — ABNORMAL HIGH (ref 9–23)
BUN: 22 mg/dL (ref 6–24)
Bilirubin Total: 0.3 mg/dL (ref 0.0–1.2)
CO2: 21 mmol/L (ref 20–29)
Calcium: 9.3 mg/dL (ref 8.7–10.2)
Chloride: 104 mmol/L (ref 96–106)
Creatinine, Ser: 0.73 mg/dL (ref 0.57–1.00)
Globulin, Total: 2.9 g/dL (ref 1.5–4.5)
Glucose: 92 mg/dL (ref 70–99)
Potassium: 4 mmol/L (ref 3.5–5.2)
Sodium: 141 mmol/L (ref 134–144)
Total Protein: 7.1 g/dL (ref 6.0–8.5)
eGFR: 106 mL/min/{1.73_m2} (ref >59)

## 2023-03-18 LAB — HEMOGLOBIN A1C
Est. average glucose Bld gHb Est-mCnc: 103 mg/dL
Hgb A1c MFr Bld: 5.2 % (ref 4.8–5.6)

## 2023-03-19 ENCOUNTER — Encounter: Payer: Self-pay | Admitting: Family Medicine

## 2023-03-21 ENCOUNTER — Encounter: Payer: Self-pay | Admitting: Family Medicine

## 2023-03-21 ENCOUNTER — Telehealth: Payer: Self-pay | Admitting: Family Medicine

## 2023-03-21 DIAGNOSIS — E785 Hyperlipidemia, unspecified: Secondary | ICD-10-CM

## 2023-03-21 NOTE — Telephone Encounter (Signed)
Called patient regarding labs. No need for statin given ASCVD is so low. Discussed diet and exercise changes. All questions answered.   Fayette Pho, MD

## 2023-04-01 ENCOUNTER — Ambulatory Visit: Payer: Commercial Managed Care - PPO | Admitting: Family Medicine

## 2023-04-01 ENCOUNTER — Other Ambulatory Visit (HOSPITAL_COMMUNITY): Payer: Self-pay

## 2023-04-01 DIAGNOSIS — L239 Allergic contact dermatitis, unspecified cause: Secondary | ICD-10-CM

## 2023-04-01 MED ORDER — BETAMETHASONE DIPROPIONATE 0.05 % EX CREA
TOPICAL_CREAM | Freq: Two times a day (BID) | CUTANEOUS | 0 refills | Status: DC
Start: 2023-04-01 — End: 2023-06-19
  Filled 2023-04-01: qty 30, 15d supply, fill #0

## 2023-04-01 MED ORDER — PREDNISONE 20 MG PO TABS
40.0000 mg | ORAL_TABLET | Freq: Every day | ORAL | 0 refills | Status: DC
Start: 2023-04-01 — End: 2023-06-19
  Filled 2023-04-01: qty 6, 3d supply, fill #0

## 2023-04-01 NOTE — Patient Instructions (Addendum)
It was wonderful to see you today.  Today we talked about:  I am sending a short course of steroids. Take 40 mg daily for 3 days. I have refilled the betamethasone. Use this twice a day. Take the hydroxyzine as well for the itching up to three times daily It may be worth seeing allergy to see what is irritating your skin.  I hope you feel better soon!  Thank you for coming to your visit as scheduled. We have had a large "no-show" problem lately, and this significantly limits our ability to see and care for patients. As a friendly reminder- if you cannot make your appointment please call to cancel. We do have a no show policy for those who do not cancel within 24 hours. Our policy is that if you miss or fail to cancel an appointment within 24 hours, 3 times in a 32-month period, you may be dismissed from our clinic.   Thank you for choosing [facility].   Please call [phone] with any questions about today's appointment.  Please be sure to schedule follow up at the front  desk before you leave today.   Sabino Dick, DO PGY-3 Family Medicine

## 2023-04-01 NOTE — Progress Notes (Signed)
    SUBJECTIVE:   CHIEF COMPLAINT / HPI:   Kristin Downs is a 42 y.o. female who presents to the [facility] clinic today to discuss the following concerns:   Rash on Body Patient was last seen on 3/5 for rash on her arm. Had not been responsive to topical steroids.  She did have a punch biopsy at that time. The biopsy returned as dermal hypersensitivity reaction- a resolt of exogenous stimuli (such as arthropod bite) or endogenous stimuli (such as a drug). Negative for fungal infection. She was advised to apply Aquaphor daily. States that the rash on her wrists have cleared.   Started on Sunday to right arm and went up her arm and down her back and to her torso. Doesn't feel the same as last rash as it is much more itchy.  She has tried Careers adviser, Cetirizine, Benadryl, Cortisone without relief.  No fevers. No new soaps, detergents, foods.    PERTINENT  PMH / PSH: Hypertension, GAD, hyperlipidemia  OBJECTIVE:   BP (!) 141/93   Pulse 69   Wt 257 lb 4 oz (116.7 kg)   LMP 03/11/2023   SpO2 98%   BMI 34.89 kg/m   Vitals:   04/01/23 0851 04/01/23 0909  BP: (!) 141/93 134/84  Pulse: 69   SpO2: 98%    General: pleasant but uncomfortable appearing, able to participate in exam Respiratory:  normal effort Skin: warm and dry, erythematous raised papules to bilateral arms with area of dry and thickened patch to right antecubital fossa and scant area of similar appearance to left flank.        ASSESSMENT/PLAN:   1. Allergic contact dermatitis, unspecified trigger Given how uncomfortable patient is, will do short course of steroid to help calm inflammation down. Advised that punch biopsy also showed a hypersensitivity reaction.  Recommend allergy eval to identify what is triggering this.  Can continue her hydroxyzine as needed for itching.  Will resend betamethasone steroids.  - predniSONE (DELTASONE) 20 MG tablet; Take 2 tablets (40 mg total) by mouth daily with breakfast for 3 days   Dispense: 6 tablet; Refill: 0 - betamethasone dipropionate 0.05 % cream; Apply topically 2 (two) times daily.  Dispense: 30 g; Refill: 0   Sabino Dick, DO [place] [facility]

## 2023-04-07 ENCOUNTER — Other Ambulatory Visit: Payer: Self-pay

## 2023-04-08 ENCOUNTER — Encounter: Payer: Self-pay | Admitting: Family Medicine

## 2023-04-08 ENCOUNTER — Ambulatory Visit: Payer: Commercial Managed Care - PPO | Admitting: Family Medicine

## 2023-04-08 ENCOUNTER — Other Ambulatory Visit (HOSPITAL_COMMUNITY): Payer: Self-pay

## 2023-04-08 VITALS — BP 122/75 | HR 74 | Ht 72.0 in | Wt 258.0 lb

## 2023-04-08 DIAGNOSIS — B079 Viral wart, unspecified: Secondary | ICD-10-CM

## 2023-04-10 NOTE — Progress Notes (Signed)
    SUBJECTIVE:   CHIEF COMPLAINT / HPI:   Wart removal Ms. Rowley presents for cryotherapy of flat warts on her right hand and forearm.  She had a couple removed via cryotherapy at her last appointment and they healed well.  She is ready for cryotherapy of the rest.   PERTINENT  PMH / PSH:  Patient Active Problem List   Diagnosis Date Noted   Encounter for surveillance of contraceptives 03/17/2023   Viral warts 02/13/2023   Hyperlipidemia 12/26/2021   Annual physical exam 12/25/2021   Obesity (BMI 30.0-34.9) 04/28/2019   Chronic non-seasonal allergic rhinitis 02/26/2019   Generalized anxiety disorder 02/26/2019   Healthcare maintenance 02/26/2019   Migraines 12/09/2018   Hypertension 12/09/2017   Allergies 12/09/1998    OBJECTIVE:   BP 122/75   Pulse 74   Ht 6' (1.829 m)   Wt 258 lb (117 kg)   LMP 04/07/2023   SpO2 100%   BMI 34.99 kg/m    Gen: awake, alert, NAD Resp: breathing comfortably on room air, speaking clearly in full sentences Cardiac: extremities warm to touch Skin: numerous pink flat warts scattered across right hand and forearm    PROCEDURE:  Cryotherapy Preoperative diagnosis: flat warts Postoperative diagnosis: flat warts  Procedure: Cryotherapy of skin lesions Performed by: Dr. Larita Fife  Preprocedure counseling: The risks, benefits, and alternatives of the procedure were discussed with the patient.   EBL: 0 ml Anesthesia: None  Lesions identified: Approx 12 lesions scattered across right hand dorsum and right forearm  A test freeze was performed ensuring coverage of entire area as above.  The cryotherapy gun was then applied for 3 seconds until an ice ball formed with a 5-7 mm border.  This was allowed to thaw and then the cryotherapy was again applied for two more rounds.   The patient tolerated the procedure well.  Return precautions provided.  Return to the office in 2-3 weeks for reevaluation.    Post-cryotherapy photos below:      ASSESSMENT/PLAN:   Viral warts Previous successful trial of cryotherapy on two hand warts with excellent resolution and healing. Patient returned today for more extensive treatment. Approx 12 lesions treated with cryotherapy. Patient aware of post-care instructions. Return PRN.     Fayette Pho, MD [facility]

## 2023-04-10 NOTE — Assessment & Plan Note (Signed)
Previous successful trial of cryotherapy on two hand warts with excellent resolution and healing. Patient returned today for more extensive treatment. Approx 12 lesions treated with cryotherapy. Patient aware of post-care instructions. Return PRN.

## 2023-04-10 NOTE — Patient Instructions (Signed)
It was wonderful to see you today. Thank you for allowing me to be a part of your care. Below is a short summary of what we discussed at your visit today:  Wart removal Today we used cryotherapy to remove about 12 warts from your right hand dorsum and forearm.  Please keep the area clean and dry.  Apply moisturizer such as vaseline, Aquaphor, or Eucerin after washing.     If you have any questions or concerns, please do not hesitate to contact us via phone or MyChart message.   Fayette Pho, MD

## 2023-04-16 ENCOUNTER — Telehealth: Payer: Commercial Managed Care - PPO | Admitting: Nurse Practitioner

## 2023-04-16 DIAGNOSIS — L089 Local infection of the skin and subcutaneous tissue, unspecified: Secondary | ICD-10-CM | POA: Diagnosis not present

## 2023-04-16 MED ORDER — CEPHALEXIN 500 MG PO CAPS
500.0000 mg | ORAL_CAPSULE | Freq: Four times a day (QID) | ORAL | 0 refills | Status: AC
Start: 2023-04-16 — End: 2023-04-23

## 2023-04-16 NOTE — Progress Notes (Signed)
Virtual Visit Consent   Kristin Downs, you are scheduled for a virtual visit with a [facility] provider today. Just as with appointments in the office, your consent must be obtained to participate. Your consent will be active for this visit and any virtual visit you may have with one of our providers in the next 365 days. If you have a MyChart account, a copy of this consent can be sent to you electronically.  As this is a virtual visit, video technology does not allow for your provider to perform a traditional examination. This may limit your provider's ability to fully assess your condition. If your provider identifies any concerns that need to be evaluated in person or the need to arrange testing (such as labs, EKG, etc.), we will make arrangements to do so. Although advances in technology are sophisticated, we cannot ensure that it will always work on either your end or our end. If the connection with a video visit is poor, the visit may have to be switched to a telephone visit. With either a video or telephone visit, we are not always able to ensure that we have a secure connection.  By engaging in this virtual visit, you consent to the provision of healthcare and authorize for your insurance to be billed (if applicable) for the services provided during this visit. Depending on your insurance coverage, you may receive a charge related to this service.  I need to obtain your verbal consent now. Are you willing to proceed with your visit today? Kristin Downs has provided verbal consent on 04/16/2023 for a virtual visit (video or telephone). Viviano Simas, FNP  Date: 04/16/2023 4:22 PM  Virtual Visit via Video Note   I, Viviano Simas, connected with  Kristin Downs  ([id number], 04-25-1981) on 04/16/23 at  4:30 PM EDT by a video-enabled telemedicine application and verified that I am speaking with the correct person using two identifiers.  Location: Patient: Virtual Visit Location Patient:  Home Provider: Virtual Visit Location Provider: Home Office   I discussed the limitations of evaluation and management by telemedicine and the availability of in person appointments. The patient expressed understanding and agreed to proceed.    History of Present Illness: Kristin Downs is a 42 y.o. who identifies as a female who was assigned female at birth, and is being seen today for an infected piercing in her ear.  Got the piercing three weeks ago   Became painful yesterday  Red today  Crusting this morning   She has been following the aftercare  The piercing is surgical titanium   Denies any metal allergies and has several other piercing   Denies any itching at the piercing   No fever  Denies any history of MRSA   Problems:  Patient Active Problem List   Diagnosis Date Noted   Encounter for surveillance of contraceptives 03/17/2023   Viral warts 02/13/2023   Hyperlipidemia 12/26/2021   Annual physical exam 12/25/2021   Obesity (BMI 30.0-34.9) 04/28/2019   Chronic non-seasonal allergic rhinitis 02/26/2019   Generalized anxiety disorder 02/26/2019   Healthcare maintenance 02/26/2019   Migraines 12/09/2018   Hypertension 12/09/2017   Allergies 12/09/1998    Allergies:  Allergies  Allergen Reactions   Other Rash    Conch fish   Prednisone Anxiety   Medications:  Current Outpatient Medications:    betamethasone dipropionate 0.05 % cream, Apply topically 2 (two) times daily., Disp: 30 g, Rfl: 0   buPROPion (WELLBUTRIN XL) 150  MG 24 hr tablet, Take 1 tablet (150 mg total) by mouth daily., Disp: 90 tablet, Rfl: 3   hydrOXYzine (ATARAX) 10 MG tablet, Take 1 tablet (10 mg total) by mouth 3 (three) times daily as needed for anxiety., Disp: 30 tablet, Rfl: 1   metoprolol succinate (TOPROL-XL) 25 MG 24 hr tablet, Take 1 tablet (25 mg total) by mouth daily., Disp: 90 tablet, Rfl: 3   norgestimate-ethinyl estradiol (VYLIBRA) 0.25-35 MG-MCG tablet, Take 1 tablet by mouth  daily., Disp: 90 tablet, Rfl: 3   ondansetron (ZOFRAN) 4 MG tablet, Take 1 tablet (4 mg total) by mouth every 6 (six) hours as needed for nausea or vomiting., Disp: 20 tablet, Rfl: 1   predniSONE (DELTASONE) 20 MG tablet, Take 2 tablets (40 mg total) by mouth daily with breakfast for 3 days, Disp: 6 tablet, Rfl: 0   rizatriptan (MAXALT) 10 MG tablet, Take 1 tablet (10 mg total) by mouth as needed for migraine. May repeat in 2 hours if needed, Disp: 10 tablet, Rfl: 1  Observations/Objective: Patient is well-developed, well-nourished in no acute distress.  Resting comfortably  at home.  Head is normocephalic, atraumatic.  No labored breathing.  Speech is clear and coherent with logical content.  Patient is alert and oriented at baseline.  Piercing to upper right ear with erythema surrounding  Piercing is in place  No drainage noted   Assessment and Plan: 1. Skin infection  - cephALEXin (KEFLEX) 500 MG capsule; Take 1 capsule (500 mg total) by mouth 4 (four) times daily for 7 days.  Dispense: 28 capsule; Refill: 0    Discussed advil for pain as needed  If area continues to be irritated after antibiotic use for 48 hours this may be inflammatory or a sensitivity to the metal used in the piercing. Recommend follow up at that time and consideration for removal of piercing.    Follow Up Instructions: I discussed the assessment and treatment plan with the patient. The patient was provided an opportunity to ask questions and all were answered. The patient agreed with the plan and demonstrated an understanding of the instructions.  A copy of instructions were sent to the patient via MyChart unless otherwise noted below.    The patient was advised to call back or seek an in-person evaluation if the symptoms worsen or if the condition fails to improve as anticipated.  Time:  I spent 15 minutes with the patient via telehealth technology discussing the above problems/concerns.    Viviano Simas,  FNP

## 2023-05-02 ENCOUNTER — Other Ambulatory Visit (HOSPITAL_COMMUNITY): Payer: Self-pay

## 2023-06-04 ENCOUNTER — Other Ambulatory Visit (HOSPITAL_COMMUNITY): Payer: Self-pay

## 2023-06-10 ENCOUNTER — Ambulatory Visit: Payer: Commercial Managed Care - PPO | Admitting: Student

## 2023-06-17 ENCOUNTER — Ambulatory Visit: Payer: Commercial Managed Care - PPO | Admitting: Student

## 2023-06-19 ENCOUNTER — Encounter: Payer: Self-pay | Admitting: Family Medicine

## 2023-06-19 ENCOUNTER — Ambulatory Visit: Payer: Self-pay | Admitting: Family Medicine

## 2023-06-19 NOTE — Progress Notes (Signed)
   SUBJECTIVE:   CHIEF COMPLAINT / HPI:  Kristin Downs is a 42 y.o. female with a pertinent past medical history of viral warts presenting to the clinic for cryotherapy of viral warts on R arm and dorsum of R hand.  Unfortunately, the [facility] ran out of liquid nitrogen 7/10 and we are unable to provide cryotherapy at this time.  Patient was advised of this fact and she opted to return a different day for cryotherapy.  PERTINENT PMH / PSH: N/A   OBJECTIVE:   BP 132/80   Pulse 82   Wt 264 lb (119.7 kg)   LMP 06/04/2023   SpO2 98%   BMI 35.80 kg/m   Skin: Viral warts present on right arm and dorsum of hand, with healing pink skin from previous cryotherapy surrounding new viral lesions.  Hyperpigmentation of older viral wart scars also present.   ASSESSMENT/PLAN:   Erroneous encounter - disregard Patient presented for cryotherapy of viral warts, but [facility] was out of liquid nitrogen.  Patient had no other concerns and was not charged for visit.  Will return later.  Return as able for cryotherapy again.  Laaibah Wartman Sharion Dove, MD [facility]

## 2023-06-19 NOTE — Assessment & Plan Note (Signed)
Patient presented for cryotherapy of viral warts, but [facility] was out of liquid nitrogen.  Patient had no other concerns and was not charged for visit.  Will return later.

## 2023-06-19 NOTE — Patient Instructions (Signed)
It was great to see you today! Thank you for choosing [facility] for your primary care.  Today we addressed:   We are checking some labs today, including .  You should return to our clinic .  Thank you for coming to see [place] at [facility] and for the opportunity to care for you! Cloys Vera, MD 06/19/2023, 4:09 PM  ____________________________________________________  Make sure to check out at the front desk before you leave today.  Please arrive at least 15 minutes prior to your scheduled appointments.  If you had blood work today, you will get a MyChart message or a letter if results are normal. Otherwise, you will get a call from [place].  If you had a referral placed, they will call you to set up an appointment. Please give [place] a call if you don't hear back in the next 2 weeks.  If you need additional refills before your next appointment, please call your pharmacy first.

## 2023-06-24 ENCOUNTER — Encounter: Payer: Commercial Managed Care - PPO | Admitting: Family Medicine

## 2024-05-18 ENCOUNTER — Encounter: Payer: Self-pay | Admitting: *Deleted
